# Patient Record
Sex: Male | Born: 1985 | Race: Black or African American | Hispanic: No | Marital: Married | Smoking: Never smoker
Health system: Southern US, Community
[De-identification: ages and names within clinical notes are randomized; demographics above are authoritative.]

## PROBLEM LIST (undated history)

## (undated) DIAGNOSIS — S83249A Other tear of medial meniscus, current injury, unspecified knee, initial encounter: Secondary | ICD-10-CM

## (undated) DIAGNOSIS — G4730 Sleep apnea, unspecified: Secondary | ICD-10-CM

---

## 2007-11-10 ENCOUNTER — Emergency Department (HOSPITAL_COMMUNITY): Admission: EM | Admit: 2007-11-10 | Discharge: 2007-11-11 | Payer: Self-pay | Admitting: Emergency Medicine

## 2008-12-10 ENCOUNTER — Emergency Department (HOSPITAL_COMMUNITY): Admission: EM | Admit: 2008-12-10 | Discharge: 2008-12-10 | Payer: Self-pay | Admitting: Emergency Medicine

## 2009-07-14 ENCOUNTER — Emergency Department (HOSPITAL_COMMUNITY): Admission: EM | Admit: 2009-07-14 | Discharge: 2009-07-14 | Payer: Self-pay | Admitting: Family Medicine

## 2010-05-07 ENCOUNTER — Emergency Department (HOSPITAL_COMMUNITY)
Admission: EM | Admit: 2010-05-07 | Discharge: 2010-05-07 | Disposition: A | Discharge status: Home / Self Care | Payer: BC Managed Care – PPO | Attending: Emergency Medicine | Admitting: Emergency Medicine

## 2010-05-07 DIAGNOSIS — M6740 Ganglion, unspecified site: Secondary | ICD-10-CM | POA: Insufficient documentation

## 2010-05-07 DIAGNOSIS — M25539 Pain in unspecified wrist: Secondary | ICD-10-CM | POA: Insufficient documentation

## 2010-11-04 ENCOUNTER — Inpatient Hospital Stay (INDEPENDENT_AMBULATORY_CARE_PROVIDER_SITE_OTHER)
Admission: RE | Admit: 2010-11-04 | Discharge: 2010-11-04 | Disposition: A | Discharge status: Home / Self Care | Payer: BC Managed Care – PPO | Source: Ambulatory Visit | Attending: Family Medicine | Admitting: Family Medicine

## 2010-11-04 DIAGNOSIS — R112 Nausea with vomiting, unspecified: Secondary | ICD-10-CM

## 2010-11-04 DIAGNOSIS — M549 Dorsalgia, unspecified: Secondary | ICD-10-CM

## 2010-11-25 LAB — WOUND CULTURE: Gram Stain: NONE SEEN

## 2012-09-02 ENCOUNTER — Ambulatory Visit (INDEPENDENT_AMBULATORY_CARE_PROVIDER_SITE_OTHER): Payer: BC Managed Care – PPO | Admitting: Physician Assistant

## 2012-09-02 VITALS — BP 118/68 | HR 62 | Temp 98.5°F | Resp 18 | Ht 70.5 in | Wt 183.0 lb

## 2012-09-02 DIAGNOSIS — B029 Zoster without complications: Secondary | ICD-10-CM

## 2012-09-02 MED ORDER — VALACYCLOVIR HCL 1 G PO TABS: 1000.0000 mg | ORAL_TABLET | Freq: Three times a day (TID) | ORAL | Status: DC

## 2012-09-02 NOTE — Progress Notes (Signed)
  Subjective:    Patient ID: Shawn [name], male    DOB: 1985-10-05, 27 y.o.   MRN: [id number]  HPI 27 year old male presents with 1 day history of rash on left side of his trunk.  States rash appeared yesterday but for the past 2 days before that he had intermittent stabbing pains in the same area.  Rash starts on his back and wraps around to the anterior portion of his trunk.  No fevers, chills, nausea, vomiting, or abdominal pain.  Did have chicken pox as a child.  No hx of similar rash or problems.  Patient is otherwise doing well with no other concerns today.     Review of Systems  Constitutional: Negative for fever and chills.  Gastrointestinal: Negative for nausea and vomiting.  Skin: Positive for rash.  Neurological: Negative for headaches.       Objective:   Physical Exam  Constitutional: He is oriented to person, place, and time. He appears well-developed and well-nourished.  HENT:  Head: Normocephalic and atraumatic.  Right Ear: External ear normal.  Left Ear: External ear normal.  Eyes: Conjunctivae are normal.  Neck: Normal range of motion.  Cardiovascular: Normal rate.   Pulmonary/Chest: Effort normal.  Neurological: He is alert and oriented to person, place, and time.  Skin:     Psychiatric: He has a normal mood and affect. His behavior is normal. Judgment and thought content normal.          Assessment & Plan:  Shingles  Start Valtrex 1000 mg tid x 7 days If pain does not improve in the next 3-4 days, let us know and can consider treating this Follow up if symptoms worsen or fail to improve.

## 2012-09-02 NOTE — Patient Instructions (Signed)
Shingles Shingles (herpes zoster) is an infection that is caused by the same virus that causes chickenpox (varicella). The infection causes a painful skin rash and fluid-filled blisters, which eventually break open, crust over, and heal. It may occur in any area of the body, but it usually affects only one side of the body or face. The pain of shingles usually lasts about 1 month. However, some people with shingles may develop long-term (chronic) pain in the affected area of the body. Shingles often occurs many years after the person had chickenpox. It is more common:  In people older than 50 years.  In people with weakened immune systems, such as those with HIV, AIDS, or cancer.  In people taking medicines that weaken the immune system, such as transplant medicines.  In people under great stress. CAUSES  Shingles is caused by the varicella zoster virus (VZV), which also causes chickenpox. After a person is infected with the virus, it can remain in the person's body for years in an inactive state (dormant). To cause shingles, the virus reactivates and breaks out as an infection in a nerve root. The virus can be spread from person to person (contagious) through contact with open blisters of the shingles rash. It will only spread to people who have not had chickenpox. When these people are exposed to the virus, they may develop chickenpox. They will not develop shingles. Once the blisters scab over, the person is no longer contagious and cannot spread the virus to others. SYMPTOMS  Shingles shows up in stages. The initial symptoms may be pain, itching, and tingling in an area of the skin. This pain is usually described as burning, stabbing, or throbbing.In a few days or Pedone, a painful red rash will appear in the area where the pain, itching, and tingling were felt. The rash is usually on one side of the body in a band or belt-like pattern. Then, the rash usually turns into fluid-filled blisters. They  will scab over and dry up in approximately 2 3 Nessel. Flu-like symptoms may also occur with the initial symptoms, the rash, or the blisters. These may include:  Fever.  Chills.  Headache.  Upset stomach. DIAGNOSIS  Your caregiver will perform a skin exam to diagnose shingles. Skin scrapings or fluid samples may also be taken from the blisters. This sample will be examined under a microscope or sent to a lab for further testing. TREATMENT  There is no specific cure for shingles. Your caregiver will likely prescribe medicines to help you manage the pain, recover faster, and avoid long-term problems. This may include antiviral drugs, anti-inflammatory drugs, and pain medicines. HOME CARE INSTRUCTIONS   Take a cool bath or apply cool compresses to the area of the rash or blisters as directed. This may help with the pain and itching.   Only take over-the-counter or prescription medicines as directed by your caregiver.   Rest as directed by your caregiver.  Keep your rash and blisters clean with mild soap and cool water or as directed by your caregiver.  Do not pick your blisters or scratch your rash. Apply an anti-itch cream or numbing creams to the affected area as directed by your caregiver.  Keep your shingles rash covered with a loose bandage (dressing).  Avoid skin contact with:  Babies.   Pregnant women.   Children with eczema.   Elderly people with transplants.   People with chronic illnesses, such as leukemia or AIDS.   Wear loose-fitting clothing to help ease   the pain of material rubbing against the rash.  Keep all follow-up appointments with your caregiver.If the area involved is on your face, you may receive a referral for follow-up to a specialist, such as an eye doctor (ophthalmologist) or an ear, nose, and throat (ENT) doctor. Keeping all follow-up appointments will help you avoid eye complications, chronic pain, or disability.  SEEK IMMEDIATE MEDICAL  CARE IF:   You have facial pain, pain around the eye area, or loss of feeling on one side of your face.  You have ear pain or ringing in your ear.  You have loss of taste.  Your pain is not relieved with prescribed medicines.   Your redness or swelling spreads.   You have more pain and swelling.  Your condition is worsening or has changed.   You have a feveror persistent symptoms for more than 2 3 days.  You have a fever and your symptoms suddenly get worse. MAKE SURE YOU:  Understand these instructions.  Will watch your condition.  Will get help right away if you are not doing well or get worse. Document Released: 02/10/2005 Document Revised: 11/05/2011 Document Reviewed: 09/25/2011 Exit[employer] Patient Information 2014 [employer].  

## 2015-09-06 ENCOUNTER — Encounter (HOSPITAL_COMMUNITY): Payer: Self-pay | Admitting: Emergency Medicine

## 2015-09-06 ENCOUNTER — Emergency Department (HOSPITAL_COMMUNITY)
Admission: EM | Admit: 2015-09-06 | Discharge: 2015-09-06 | Disposition: A | Discharge status: Home / Self Care | Payer: 59 | Attending: Emergency Medicine | Admitting: Emergency Medicine

## 2015-09-06 DIAGNOSIS — H9202 Otalgia, left ear: Secondary | ICD-10-CM | POA: Diagnosis present

## 2015-09-06 DIAGNOSIS — Z791 Long term (current) use of non-steroidal anti-inflammatories (NSAID): Secondary | ICD-10-CM | POA: Insufficient documentation

## 2015-09-06 DIAGNOSIS — H6122 Impacted cerumen, left ear: Secondary | ICD-10-CM | POA: Insufficient documentation

## 2015-09-06 MED ORDER — DOCUSATE SODIUM 50 MG/5ML PO LIQD
200.0000 mg | Freq: Once | ORAL | Status: AC
  Administered 2015-09-06: 200 mg via ORAL
  Filled 2015-09-06: qty 20

## 2015-09-06 MED ORDER — HYDROGEN PEROXIDE 3 % EX SOLN
CUTANEOUS | Status: AC
  Filled 2015-09-06: qty 473

## 2015-09-06 MED ORDER — HYDROGEN PEROXIDE 3 % EX SOLN
CUTANEOUS | Status: AC
  Administered 2015-09-06: 14:00:00
  Filled 2015-09-06: qty 473

## 2015-09-06 MED ORDER — CARBAMIDE PEROXIDE 6.5 % OT SOLN: 5.0000 [drp] | Freq: Two times a day (BID) | OTIC | Status: DC

## 2015-09-06 NOTE — Progress Notes (Signed)
[facility] ED CM noted pt with coverage but no pcp listed [facility] ED CM spoke with pt on how to obtain an in network pcp with insurance coverage via the customer service number or web site  Cm reviewed ED level of care for crisis/emergent services and community pcp level of care to manage continuous or chronic medical concerns.  The pt voiced understanding CM encouraged pt and discussed pt's responsibility to verify with pt's insurance carrier that any recommended medical provider offered by any emergency room or a hospital provider is within the carrier's network. The pt voiced understanding   

## 2015-09-06 NOTE — ED Notes (Signed)
Pt c/o L ear pain, denies n/v/d, denies fever

## 2015-09-06 NOTE — ED Provider Notes (Signed)
CSN: [id number]     Arrival date & time 09/06/15  0446 History   First MD Initiated Contact with Patient 09/06/15 0751     Chief Complaint  Patient presents with  . Otalgia    (Consider location/radiation/quality/duration/timing/severity/associated sxs/prior Treatment) Patient is a 30 y.o. male presenting with ear pain. The history is provided by the patient and medical records. No language interpreter was used.  Otalgia Associated symptoms: no abdominal pain, no congestion, no cough, no fever and no sore throat     Jp Gaspar is an otherwise healthy 30 y.o. male  who presents to the Emergency Department complaining of left ear pain. Patient states 1-2 [place] ago ear "felt clogged" - acute onset of throbbing pain began last night / early this morning. No recent swimming. No fever/chills, chest pain, sob, nasal congestion, sore throat. Ibuprofen taken prior to arrival with no relief. Has tried to clean out this ears with q-tips, but states he does not feel like he really got anything out. Hx of needing ears cleaned in the past per patient.    History reviewed. No pertinent past medical history. History reviewed. No pertinent past surgical history. Family History  Problem Relation Age of Onset  . Diabetes Mother    Social History  Substance Use Topics  . Smoking status: Never Smoker   . Smokeless tobacco: None  . Alcohol Use: Yes    Review of Systems  Constitutional: Negative for fever.  HENT: Positive for ear pain. Negative for congestion and sore throat.   Respiratory: Negative for cough and shortness of breath.   Cardiovascular: Negative for chest pain.  Gastrointestinal: Negative for abdominal pain.      Allergies  Review of patient's allergies indicates no known allergies.  Home Medications   Prior to Admission medications   Medication Sig Start Date End Date Taking? Authorizing Provider  ibuprofen (ADVIL,MOTRIN) 200 MG tablet Take 400 mg by mouth every 6 (six) hours  as needed for headache, mild pain or moderate pain.   Yes Historical Provider, MD  carbamide peroxide (DEBROX) 6.5 % otic solution Place 5 drops into the left ear 2 (two) times daily. 09/06/15   Chase Picket Larken Urias, PA-C  valACYclovir (VALTREX) 1000 MG tablet Take 1 tablet (1,000 mg total) by mouth 3 (three) times daily. Patient not taking: Reported on 09/06/2015 09/02/12   Heather M Marte, PA-C   BP 124/81 mmHg  Pulse 55  Temp(Src) 98.2 F (36.8 C) (Oral)  Resp 16  SpO2 100% Physical Exam  Constitutional: He is oriented to person, place, and time. He appears well-developed and well-nourished. No distress.  HENT:  Head: Normocephalic and atraumatic.  Mouth/Throat: Oropharynx is clear and moist.  Bilateral ears with cerumen impaction. No mastoid tenderness.   Neck: Normal range of motion. Neck supple.  No meningeal signs.   Cardiovascular: Normal rate, regular rhythm and normal heart sounds.   Pulmonary/Chest: Effort normal.  Lungs are clear to auscultation bilaterally - no w/r/r  Abdominal: Soft. He exhibits no distension. There is no tenderness.  Musculoskeletal: Normal range of motion.  Neurological: He is alert and oriented to person, place, and time.  Skin: Skin is warm and dry. He is not diaphoretic.  Nursing note and vitals reviewed.   ED Course  .Ear Cerumen Removal Date/Time: 09/06/2015 2:37 PM Performed by: Janyth Contes Authorized by: Janyth Contes Consent: Verbal consent obtained. Risks and benefits: risks, benefits and alternatives were discussed Patient understanding: patient states understanding of the procedure being performed  Patient identity confirmed: verbally with patient Ceruminolytics applied: Ceruminolytics applied prior to the procedure. Location details: left ear Procedure type: curette Patient tolerance: Patient tolerated the procedure well with no immediate complications Comments: Moderate amount of dark brown cerumen removed from ear,  partially visualization of TM which is not erythematous or bulging.   (including critical care time) Labs Review Labs Reviewed - No data to display  Imaging Review No results found. I have personally reviewed and evaluated these images and lab results as part of my medical decision-making.   EKG Interpretation None      MDM   Final diagnoses:  Cerumen impaction, left  Otalgia of left ear   Nimesh Carillo presents to ED for left ear pain. 1-2 [place] of ear feeling clogged, 1 day of throbbing pain. On exam, patient is afebrile and hemodynamically stable. Unable to visualize TM 2/2 cerumen impaction.   Cerumen removal performed by nursing staff after colace soak with moderate removal but TM still not visualized. Another soak then removal performed by myself with lighted curette. I was able to remove a moderate amount as well and visualize TM which was not erythematous or bulging. Doubt infection. I imagine pain and sensation of ear clogging is 2/2 large cerumen impaction. One more soak and removal by nursing staff. Per nurse, she was able to remove large amount of wax on 3rd attempt and patient had immediate relief of symptoms. He has a follow up appointment with ENT scheduled for 7/27 and I encouraged him to keep this scheduled appointment. Home care instructions and return precautions discussed. All questions answered.    [facility] HospitalJaime Pilcher Breya Cass, PA-C 09/06/15 1441  Derwood KaplanAnkit Nanavati, MD 09/06/15 1456

## 2015-09-06 NOTE — Discharge Instructions (Signed)
Use debrox ear drops as directed.  Return to ER or see urgent care/primary care in 3-4 days for recheck of ear if symptoms do not improve. Return to ER for new or worsening symptoms, any additional concerns.  Keep your scheduled appointment with the ear/nose/throat physician.

## 2015-09-06 NOTE — ED Notes (Signed)
Medium amount of brown cerum removed from left ear. Left tympanic membrane still not completely visible. PA made aware and to bedside.

## 2019-06-27 ENCOUNTER — Other Ambulatory Visit (HOSPITAL_BASED_OUTPATIENT_CLINIC_OR_DEPARTMENT_OTHER): Payer: Self-pay

## 2019-06-27 DIAGNOSIS — R0683 Snoring: Secondary | ICD-10-CM

## 2019-06-27 DIAGNOSIS — R0681 Apnea, not elsewhere classified: Secondary | ICD-10-CM

## 2019-09-09 ENCOUNTER — Ambulatory Visit (HOSPITAL_BASED_OUTPATIENT_CLINIC_OR_DEPARTMENT_OTHER): Payer: BC Managed Care – PPO | Admitting: Internal Medicine

## 2019-09-12 ENCOUNTER — Ambulatory Visit (HOSPITAL_BASED_OUTPATIENT_CLINIC_OR_DEPARTMENT_OTHER): Payer: BC Managed Care – PPO | Admitting: Internal Medicine

## 2019-10-17 ENCOUNTER — Encounter (HOSPITAL_BASED_OUTPATIENT_CLINIC_OR_DEPARTMENT_OTHER): Payer: BC Managed Care – PPO | Admitting: Internal Medicine

## 2019-10-26 ENCOUNTER — Ambulatory Visit (HOSPITAL_BASED_OUTPATIENT_CLINIC_OR_DEPARTMENT_OTHER): Payer: BC Managed Care – PPO | Attending: Otolaryngology | Admitting: Internal Medicine

## 2019-11-09 ENCOUNTER — Other Ambulatory Visit: Payer: Self-pay

## 2019-11-09 ENCOUNTER — Ambulatory Visit (HOSPITAL_BASED_OUTPATIENT_CLINIC_OR_DEPARTMENT_OTHER): Payer: BC Managed Care – PPO | Attending: Otolaryngology | Admitting: Internal Medicine

## 2019-11-09 DIAGNOSIS — R0681 Apnea, not elsewhere classified: Secondary | ICD-10-CM

## 2019-11-09 DIAGNOSIS — R0683 Snoring: Secondary | ICD-10-CM

## 2019-11-09 DIAGNOSIS — G4733 Obstructive sleep apnea (adult) (pediatric): Secondary | ICD-10-CM | POA: Insufficient documentation

## 2019-11-12 DIAGNOSIS — R0683 Snoring: Secondary | ICD-10-CM | POA: Diagnosis not present

## 2019-11-12 NOTE — Procedures (Signed)
    Patient Name: Shawn [name], Shawn [name] Date: 11/09/2019 Gender: Male D.O.B: Jul 05, 1985 Age (years): 33 Referring Provider: Christia Reading Height (inches): 71 Interpreting Physician: Jetty Duhamel MD, ABSM Weight (lbs): 190 RPSGT: [place] BMI: 26 MRN: [id number] Neck Size: 16.00  CLINICAL INFORMATION Sleep Study Type: HST Indication for sleep study: OSA Epworth Sleepiness Score: 10  SLEEP STUDY TECHNIQUE A multi-channel overnight portable sleep study was performed. The channels recorded were: nasal airflow, thoracic respiratory movement, and oxygen saturation with a pulse oximetry. Snoring was also monitored.  MEDICATIONS Patient self administered medications include: none reported.  SLEEP ARCHITECTURE Patient was studied for 260.4 minutes. The sleep efficiency was 100.0 % and the patient was supine for 0%. The arousal index was 0.0 per hour.  RESPIRATORY PARAMETERS The overall AHI was 27.2 per hour, with a central apnea index of 0.0 per hour. The oxygen nadir was 72% during sleep.  CARDIAC DATA Mean heart rate during sleep was 69.9 bpm.  IMPRESSIONS - Moderate obstructive sleep apnea occurred during this study (AHI = 27.2/h). - No significant central sleep apnea occurred during this study (CAI = 0.0/h). - Oxygen desaturation was noted during this study (Min O2 = 72%). Mean sat 92%. - Patient snored.  DIAGNOSIS - Obstructive Sleep Apnea (G47.33)  RECOMMENDATIONS - Suggest CPAP titration sleep study or autopap. Other options would be based on clinical judgment. - Be careful with alcohol, sedatives and other CNS depressants that may worsen sleep apnea and disrupt normal sleep architecture. - Sleep hygiene should be reviewed to assess factors that may improve sleep quality. - Weight management and regular exercise should be initiated or continued.  [Electronically signed] 11/12/2019 12:08 PM  Jetty Duhamel MD, ABSM Diplomate, American Board of Sleep  Medicine   NPI: [id number]                         Jetty Duhamel Diplomate, American Board of Sleep Medicine  ELECTRONICALLY SIGNED ON:  11/12/2019, 12:04 PM [facility] PH: [phone]   FX: [phone] ACCREDITED BY THE AMERICAN ACADEMY OF SLEEP MEDICINE

## 2020-05-16 ENCOUNTER — Emergency Department (HOSPITAL_COMMUNITY)
Admission: EM | Admit: 2020-05-16 | Discharge: 2020-05-16 | Disposition: A | Discharge status: Home / Self Care | Payer: BC Managed Care – PPO | Attending: Emergency Medicine | Admitting: Emergency Medicine

## 2020-05-16 ENCOUNTER — Emergency Department (HOSPITAL_COMMUNITY): Payer: BC Managed Care – PPO

## 2020-05-16 ENCOUNTER — Other Ambulatory Visit: Payer: Self-pay

## 2020-05-16 ENCOUNTER — Encounter (HOSPITAL_COMMUNITY): Payer: Self-pay | Admitting: Emergency Medicine

## 2020-05-16 DIAGNOSIS — X501XXA Overexertion from prolonged static or awkward postures, initial encounter: Secondary | ICD-10-CM | POA: Insufficient documentation

## 2020-05-16 DIAGNOSIS — R52 Pain, unspecified: Secondary | ICD-10-CM

## 2020-05-16 DIAGNOSIS — Y9367 Activity, basketball: Secondary | ICD-10-CM | POA: Diagnosis not present

## 2020-05-16 DIAGNOSIS — S8992XA Unspecified injury of left lower leg, initial encounter: Secondary | ICD-10-CM | POA: Diagnosis present

## 2020-05-16 DIAGNOSIS — S86812A Strain of other muscle(s) and tendon(s) at lower leg level, left leg, initial encounter: Secondary | ICD-10-CM | POA: Insufficient documentation

## 2020-05-16 NOTE — Discharge Instructions (Addendum)
As discussed, you have been diagnosed with a rupture of the patella tendon.  Please be sure to call our orthopedic colleagues tomorrow for appropriate ongoing outpatient management.  Tylenol, ice, ibuprofen will all be beneficial for pain control.  Return here for concerning changes in your condition.  Below is the MRI result from tonight:  IMPRESSION:  Complete tear of the patellar tendon from the inferior patellar pole  with approximately 7 mm of tendon retraction and resultant patellar  Alta. There is adjacent reactive marrow at the inferior patellar  pole.   Significant surrounding prepatellar and Hoffa's fat pad edema   Nondisplaced posterior medial meniscus tear   Intrasubstance sprain of the ACL with a probable tiny interstitial  tear

## 2020-05-16 NOTE — ED Triage Notes (Signed)
Patient presents with a possible dislocated left knee. Patient sustained this injury from falling while playing basketball. He noticed pain immediately when he fell on another players foot. EMS noted deformity to the left lower extremity.    EMS vitals: 132/92 BP 98 HR 97%

## 2020-05-16 NOTE — ED Provider Notes (Signed)
[facility]-EMERGENCY DEPT Provider Note   CSN: [id number] Arrival date & time: 05/16/20  1950     History Chief Complaint  Patient presents with  . Knee Pain    Shawn [name] is a 35 y.o. male.  HPI Patient presents with an inability to extend the left knee.  Patient has minimal pain in the area.  Onset of symptoms was just prior to ED arrival.  He was playing basketball, with no other individual with anything as he was landing following a jump. Patient landed awkwardly and since that time has been unable to extend the left leg secondary to issues with the left knee.  He did not fall entirely, has had no head pain, neck pain, chest pain, other complaints.  With no motion there are no complaints, but patient is unable to bear weight. He is generally well, denies medical problems, takes no medication regularly.    History reviewed. No pertinent past medical history.  There are no problems to display for this patient.   History reviewed. No pertinent surgical history.     Family History  Problem Relation Age of Onset  . Diabetes Mother     Social History   Tobacco Use  . Smoking status: Never Smoker  . Smokeless tobacco: Never Used  Substance Use Topics  . Alcohol use: Yes  . Drug use: No    Home Medications Prior to Admission medications   Medication Sig Start Date End Date Taking? Authorizing Provider  carbamide peroxide (DEBROX) 6.5 % otic solution Place 5 drops into the left ear 2 (two) times daily. 09/06/15   Ward, Chase Picket, PA-C  ibuprofen (ADVIL,MOTRIN) 200 MG tablet Take 400 mg by mouth every 6 (six) hours as needed for headache, mild pain or moderate pain.    [provider]  valACYclovir (VALTREX) 1000 MG tablet Take 1 tablet (1,000 mg total) by mouth 3 (three) times daily. Patient not taking: Reported on 09/06/2015 09/02/12   Nelva Nay, PA-C    Allergies    Patient has no known allergies.  Review of Systems    Review of Systems  Constitutional:       Per HPI, otherwise negative  HENT:       Per HPI, otherwise negative  Respiratory:       Per HPI, otherwise negative  Cardiovascular:       Per HPI, otherwise negative  Gastrointestinal: Negative for vomiting.  Endocrine:       Negative aside from HPI  Genitourinary:       Neg aside from HPI   Musculoskeletal:       Per HPI, otherwise negative  Skin: Negative.   Neurological: Negative for syncope.    Physical Exam Updated Vital Signs BP 140/80 (BP Location: Left Arm)   Pulse 80   Temp 99.9 F (37.7 C) (Oral)   Resp 17   Ht 5\' 11"  (1.803 m)   Wt 85.7 kg   SpO2 98%   BMI 26.36 kg/m   Physical Exam Vitals and nursing note reviewed.  Constitutional:      General: He is not in acute distress.    Appearance: He is well-developed.  HENT:     Head: Normocephalic and atraumatic.  Eyes:     Conjunctiva/sclera: Conjunctivae normal.  Cardiovascular:     Rate and Rhythm: Normal rate and regular rhythm.     Pulses: Normal pulses.  Pulmonary:     Effort: Pulmonary effort is normal. No respiratory distress.  Breath sounds: No stridor.  Abdominal:     General: There is no distension.  Musculoskeletal:     Comments: Patella alta, left side, with appreciable effusion inferior to the patella.  Patient can flex the left knee appropriately, but no capacity to extend the left knee.  He does flex and extend the hip spontaneously and to command, denies hip pain.  Ankle unremarkable as well.  Skin:    General: Skin is warm and dry.  Neurological:     Mental Status: He is alert and oriented to person, place, and time.      ED Results / Procedures / Treatments   Labs (all labs ordered are listed, but only abnormal results are displayed) Labs Reviewed - No data to display  EKG None  Radiology DG Knee 1-2 Views Left  Result Date: 05/16/2020 CLINICAL DATA:  35 year old male with trauma to the left knee. EXAM: LEFT KNEE - 1-2 VIEW  COMPARISON:  None. FINDINGS: There is elevation of the patella. Two small bone fragments in the anterior knee adjacent to the patellar tendon are suspicious for avulsion fracture. There is apparent 3 cm gap between the proximal patellar tendon and inferior patella suspicious for tendon rupture. No other acute fracture. The bones are well mineralized. No arthritic changes. No significant joint effusion. Mild soft tissues swelling of the anterior knee. IMPRESSION: Findings most suspicious for patellar tendon rupture and avulsion from the patellar apex. There is elevation of the patella. Further evaluation with MRI is recommended. Electronically Signed   By: Elgie Collard M.D.   On: 05/16/2020 21:06   MR KNEE LEFT WO CONTRAST  Result Date: 05/16/2020 CLINICAL DATA:  Basketball injury with knee pain EXAM: MRI OF THE LEFT KNEE WITHOUT CONTRAST TECHNIQUE: Multiplanar, multisequence MR imaging of the knee was performed. No intravenous contrast was administered. COMPARISON:  Radiograph same day FINDINGS: MENISCI Medial: There is a nondisplaced horizontal longitudinal tear seen of the posterior horn of the medial meniscus. Lateral: Intact. LIGAMENTS Cruciates: There is increased signal seen throughout the ACL with a probable partial interstitial tear seen at the posterior femoral insertion site. The PCL is intact. Collaterals: Mildly increased signal seen around the medial collateral ligament. The lateral collateral ligamentous complex is intact. CARTILAGE Patellofemoral: Normal. Medial compartment: Normal. Lateral compartment: Normal. BONES: Increased marrow signal seen at the inferior patellar pole. No displaced fractures are identified. JOINT: A small knee joint effusion is present. EXTENSOR MECHANISM: Complete disruption of the patellar tendon from the inferior patellar pole with approximately 7 mm of tendon retraction. Prepatellar edema with fluid and edema within the Hoffa's fat pad is seen. There is slight  patellar Oda Kilts present. The quadriceps tendon is intact. POPLITEAL FOSSA: No popliteal cyst. OTHER:  The visualized muscles are normal in appearance. IMPRESSION: Complete tear of the patellar tendon from the inferior patellar pole with approximately 7 mm of tendon retraction and resultant patellar Alta. There is adjacent reactive marrow at the inferior patellar pole. Significant surrounding prepatellar and Hoffa's fat pad edema Nondisplaced posterior medial meniscus tear Intrasubstance sprain of the ACL with a probable tiny interstitial tear Electronically Signed   By: Jonna Clark M.D.   On: 05/16/2020 22:33    Procedures Procedures   Medications Ordered in ED Medications - No data to display  ED Course  I have reviewed the triage vital signs and the nursing notes.  Pertinent labs & imaging results that were available during my care of the patient were reviewed by me and considered in  my medical decision making (see chart for details).   Repeat exam patient is in no distress.  I discussed x-ray abnormalities.   10:49 PM I discussed patient's case with her orthopedic colleague, Dr. Yevette Edwards. Repeat exam the patient is in similar condition.  No new complaints per He and I discussed x-ray results, MRI results. Both findings consistent with physical exam concerning for disruption of the patella tendon. Without other complaints, patient has had knee immobilization, crutches provided, will follow up tomorrow with our orthopedic colleagues.  Final Clinical Impression(s) / ED Diagnoses Final diagnoses:  Rupture of left patellar tendon, initial encounter    Rx / DC Orders ED Discharge Orders    None       Gerhard Munch, MD 05/16/20 2250

## 2020-05-17 ENCOUNTER — Encounter (HOSPITAL_BASED_OUTPATIENT_CLINIC_OR_DEPARTMENT_OTHER): Payer: Self-pay | Admitting: Orthopedic Surgery

## 2020-05-18 ENCOUNTER — Ambulatory Visit: Payer: Self-pay | Admitting: Physician Assistant

## 2020-05-18 NOTE — H&P (Signed)
Shawn [name] is an 35 y.o. male.   Chief Complaint: left knee pain and inability to straighten knee HPI: Patient playing basketball on 3/23 landed awkwardly following a jump and left knee gave out.  He had inability to straighten left knee following.  He was seen and evaluated in ER with xrays and MRI which confirmed medial meniscus tear and complete patellar tendon tear from inferior pole.  He was seen in our outpatient setting for treatment recommendations.  Past Medical History:  Diagnosis Date  . Medial meniscus tear y  . Sleep apnea     No past surgical history on file.  Family History  Problem Relation Age of Onset  . Diabetes Mother    Social History:  reports that he has never smoked. He has never used smokeless tobacco. He reports current alcohol use. He reports that he does not use drugs.  Allergies: No Known Allergies  (Not in a hospital admission)   No results found for this or any previous visit (from the past 48 hour(s)). DG Knee 1-2 Views Left  Result Date: 05/16/2020 CLINICAL DATA:  35 year old male with trauma to the left knee. EXAM: LEFT KNEE - 1-2 VIEW COMPARISON:  None. FINDINGS: There is elevation of the patella. Two small bone fragments in the anterior knee adjacent to the patellar tendon are suspicious for avulsion fracture. There is apparent 3 cm gap between the proximal patellar tendon and inferior patella suspicious for tendon rupture. No other acute fracture. The bones are well mineralized. No arthritic changes. No significant joint effusion. Mild soft tissues swelling of the anterior knee. IMPRESSION: Findings most suspicious for patellar tendon rupture and avulsion from the patellar apex. There is elevation of the patella. Further evaluation with MRI is recommended. Electronically Signed   By: Elgie Collard M.D.   On: 05/16/2020 21:06   MR KNEE LEFT WO CONTRAST  Result Date: 05/16/2020 CLINICAL DATA:  Basketball injury with knee pain EXAM: MRI OF THE LEFT  KNEE WITHOUT CONTRAST TECHNIQUE: Multiplanar, multisequence MR imaging of the knee was performed. No intravenous contrast was administered. COMPARISON:  Radiograph same day FINDINGS: MENISCI Medial: There is a nondisplaced horizontal longitudinal tear seen of the posterior horn of the medial meniscus. Lateral: Intact. LIGAMENTS Cruciates: There is increased signal seen throughout the ACL with a probable partial interstitial tear seen at the posterior femoral insertion site. The PCL is intact. Collaterals: Mildly increased signal seen around the medial collateral ligament. The lateral collateral ligamentous complex is intact. CARTILAGE Patellofemoral: Normal. Medial compartment: Normal. Lateral compartment: Normal. BONES: Increased marrow signal seen at the inferior patellar pole. No displaced fractures are identified. JOINT: A small knee joint effusion is present. EXTENSOR MECHANISM: Complete disruption of the patellar tendon from the inferior patellar pole with approximately 7 mm of tendon retraction. Prepatellar edema with fluid and edema within the Hoffa's fat pad is seen. There is slight patellar Oda Kilts present. The quadriceps tendon is intact. POPLITEAL FOSSA: No popliteal cyst. OTHER:  The visualized muscles are normal in appearance. IMPRESSION: Complete tear of the patellar tendon from the inferior patellar pole with approximately 7 mm of tendon retraction and resultant patellar Alta. There is adjacent reactive marrow at the inferior patellar pole. Significant surrounding prepatellar and Hoffa's fat pad edema Nondisplaced posterior medial meniscus tear Intrasubstance sprain of the ACL with a probable tiny interstitial tear Electronically Signed   By: Jonna Clark M.D.   On: 05/16/2020 22:33    Review of Systems  Musculoskeletal: Positive for gait problem  and joint swelling.  All other systems reviewed and are negative.   There were no vitals taken for this visit. Physical Exam Constitutional:       Appearance: Normal appearance.  HENT:     Head: Normocephalic and atraumatic.  Eyes:     Extraocular Movements: Extraocular movements intact.     Pupils: Pupils are equal, round, and reactive to light.  Cardiovascular:     Rate and Rhythm: Normal rate.     Pulses: Normal pulses.  Pulmonary:     Effort: Pulmonary effort is normal. No respiratory distress.  Abdominal:     General: Abdomen is flat. There is no distension.     Tenderness: There is no abdominal tenderness.  Musculoskeletal:     Cervical back: Normal range of motion and neck supple.     Left knee: Swelling, deformity, effusion and bony tenderness present. No erythema or lacerations. Decreased range of motion. Tenderness present over the medial joint line. Abnormal patellar mobility.  Skin:    General: Skin is warm and dry.     Findings: No erythema or rash.  Neurological:     General: No focal deficit present.     Mental Status: He is alert and oriented to person, place, and time.  Psychiatric:        Mood and Affect: Mood normal.        Behavior: Behavior normal.      Assessment/Plan Left knee patellar tendon tear and medial meniscus tear  Discussed risks and benefits of left knee arthroscopy medial menisectomy and patellar tendon repair he wishes to proceed.  We will get this scheduled in an outpatient setting as soon as possible.  He is in a knee immobilizer and may WBAT.    Margart Sickles, PA-C 05/18/2020, 8:02 AM

## 2020-05-18 NOTE — H&P (View-Only) (Signed)
Shawn [name] is an 35 y.o. male.   Chief Complaint: left knee pain and inability to straighten knee HPI: Patient playing basketball on 3/23 landed awkwardly following a jump and left knee gave out.  He had inability to straighten left knee following.  He was seen and evaluated in ER with xrays and MRI which confirmed medial meniscus tear and complete patellar tendon tear from inferior pole.  He was seen in our outpatient setting for treatment recommendations.  Past Medical History:  Diagnosis Date  . Medial meniscus tear y  . Sleep apnea     No past surgical history on file.  Family History  Problem Relation Age of Onset  . Diabetes Mother    Social History:  reports that he has never smoked. He has never used smokeless tobacco. He reports current alcohol use. He reports that he does not use drugs.  Allergies: No Known Allergies  (Not in a hospital admission)   No results found for this or any previous visit (from the past 48 hour(s)). DG Knee 1-2 Views Left  Result Date: 05/16/2020 CLINICAL DATA:  35 year old male with trauma to the left knee. EXAM: LEFT KNEE - 1-2 VIEW COMPARISON:  None. FINDINGS: There is elevation of the patella. Two small bone fragments in the anterior knee adjacent to the patellar tendon are suspicious for avulsion fracture. There is apparent 3 cm gap between the proximal patellar tendon and inferior patella suspicious for tendon rupture. No other acute fracture. The bones are well mineralized. No arthritic changes. No significant joint effusion. Mild soft tissues swelling of the anterior knee. IMPRESSION: Findings most suspicious for patellar tendon rupture and avulsion from the patellar apex. There is elevation of the patella. Further evaluation with MRI is recommended. Electronically Signed   By: Elgie Collard M.D.   On: 05/16/2020 21:06   MR KNEE LEFT WO CONTRAST  Result Date: 05/16/2020 CLINICAL DATA:  Basketball injury with knee pain EXAM: MRI OF THE LEFT  KNEE WITHOUT CONTRAST TECHNIQUE: Multiplanar, multisequence MR imaging of the knee was performed. No intravenous contrast was administered. COMPARISON:  Radiograph same day FINDINGS: MENISCI Medial: There is a nondisplaced horizontal longitudinal tear seen of the posterior horn of the medial meniscus. Lateral: Intact. LIGAMENTS Cruciates: There is increased signal seen throughout the ACL with a probable partial interstitial tear seen at the posterior femoral insertion site. The PCL is intact. Collaterals: Mildly increased signal seen around the medial collateral ligament. The lateral collateral ligamentous complex is intact. CARTILAGE Patellofemoral: Normal. Medial compartment: Normal. Lateral compartment: Normal. BONES: Increased marrow signal seen at the inferior patellar pole. No displaced fractures are identified. JOINT: A small knee joint effusion is present. EXTENSOR MECHANISM: Complete disruption of the patellar tendon from the inferior patellar pole with approximately 7 mm of tendon retraction. Prepatellar edema with fluid and edema within the Hoffa's fat pad is seen. There is slight patellar Oda Kilts present. The quadriceps tendon is intact. POPLITEAL FOSSA: No popliteal cyst. OTHER:  The visualized muscles are normal in appearance. IMPRESSION: Complete tear of the patellar tendon from the inferior patellar pole with approximately 7 mm of tendon retraction and resultant patellar Alta. There is adjacent reactive marrow at the inferior patellar pole. Significant surrounding prepatellar and Hoffa's fat pad edema Nondisplaced posterior medial meniscus tear Intrasubstance sprain of the ACL with a probable tiny interstitial tear Electronically Signed   By: Jonna Clark M.D.   On: 05/16/2020 22:33    Review of Systems  Musculoskeletal: Positive for gait problem  and joint swelling.  All other systems reviewed and are negative.   There were no vitals taken for this visit. Physical Exam Constitutional:       Appearance: Normal appearance.  HENT:     Head: Normocephalic and atraumatic.  Eyes:     Extraocular Movements: Extraocular movements intact.     Pupils: Pupils are equal, round, and reactive to light.  Cardiovascular:     Rate and Rhythm: Normal rate.     Pulses: Normal pulses.  Pulmonary:     Effort: Pulmonary effort is normal. No respiratory distress.  Abdominal:     General: Abdomen is flat. There is no distension.     Tenderness: There is no abdominal tenderness.  Musculoskeletal:     Cervical back: Normal range of motion and neck supple.     Left knee: Swelling, deformity, effusion and bony tenderness present. No erythema or lacerations. Decreased range of motion. Tenderness present over the medial joint line. Abnormal patellar mobility.  Skin:    General: Skin is warm and dry.     Findings: No erythema or rash.  Neurological:     General: No focal deficit present.     Mental Status: He is alert and oriented to person, place, and time.  Psychiatric:        Mood and Affect: Mood normal.        Behavior: Behavior normal.      Assessment/Plan Left knee patellar tendon tear and medial meniscus tear  Discussed risks and benefits of left knee arthroscopy medial menisectomy and patellar tendon repair he wishes to proceed.  We will get this scheduled in an outpatient setting as soon as possible.  He is in a knee immobilizer and may WBAT.    Asley Baskerville, PA-C 05/18/2020, 8:02 AM   

## 2020-05-21 ENCOUNTER — Other Ambulatory Visit (HOSPITAL_COMMUNITY)
Admission: RE | Admit: 2020-05-21 | Discharge: 2020-05-21 | Disposition: A | Discharge status: Home / Self Care | Payer: BC Managed Care – PPO | Source: Ambulatory Visit | Attending: Orthopedic Surgery | Admitting: Orthopedic Surgery

## 2020-05-21 DIAGNOSIS — Z01812 Encounter for preprocedural laboratory examination: Secondary | ICD-10-CM | POA: Insufficient documentation

## 2020-05-21 DIAGNOSIS — Y9367 Activity, basketball: Secondary | ICD-10-CM | POA: Diagnosis not present

## 2020-05-21 DIAGNOSIS — Z20822 Contact with and (suspected) exposure to covid-19: Secondary | ICD-10-CM | POA: Insufficient documentation

## 2020-05-21 DIAGNOSIS — G4730 Sleep apnea, unspecified: Secondary | ICD-10-CM | POA: Diagnosis not present

## 2020-05-21 DIAGNOSIS — S76112A Strain of left quadriceps muscle, fascia and tendon, initial encounter: Secondary | ICD-10-CM | POA: Diagnosis not present

## 2020-05-21 DIAGNOSIS — X58XXXA Exposure to other specified factors, initial encounter: Secondary | ICD-10-CM | POA: Diagnosis not present

## 2020-05-22 ENCOUNTER — Other Ambulatory Visit (HOSPITAL_COMMUNITY): Payer: BC Managed Care – PPO

## 2020-05-22 LAB — SARS CORONAVIRUS 2 (TAT 6-24 HRS): SARS Coronavirus 2: NEGATIVE

## 2020-05-22 NOTE — Progress Notes (Signed)
Reminded pt to come in for lab work. Pt verbalized understanding.

## 2020-05-23 ENCOUNTER — Encounter (HOSPITAL_BASED_OUTPATIENT_CLINIC_OR_DEPARTMENT_OTHER)
Admission: RE | Admit: 2020-05-23 | Discharge: 2020-05-23 | Disposition: A | Discharge status: Home / Self Care | Payer: BC Managed Care – PPO | Source: Ambulatory Visit | Attending: Orthopedic Surgery | Admitting: Orthopedic Surgery

## 2020-05-23 DIAGNOSIS — Z01818 Encounter for other preprocedural examination: Secondary | ICD-10-CM | POA: Insufficient documentation

## 2020-05-23 DIAGNOSIS — S76112A Strain of left quadriceps muscle, fascia and tendon, initial encounter: Secondary | ICD-10-CM | POA: Diagnosis not present

## 2020-05-23 LAB — APTT: aPTT: 28 seconds (ref 24–36)

## 2020-05-23 LAB — COMPREHENSIVE METABOLIC PANEL
ALT: 31 U/L (ref 0–44)
AST: 26 U/L (ref 15–41)
Albumin: 3.5 g/dL (ref 3.5–5.0)
Alkaline Phosphatase: 75 U/L (ref 38–126)
Anion gap: 6 (ref 5–15)
BUN: 13 mg/dL (ref 6–20)
CO2: 29 mmol/L (ref 22–32)
Calcium: 9.4 mg/dL (ref 8.9–10.3)
Chloride: 104 mmol/L (ref 98–111)
Creatinine, Ser: 1.04 mg/dL (ref 0.61–1.24)
GFR, Estimated: >60 mL/min (ref >60)
Glucose, Bld: 103 mg/dL — ABNORMAL HIGH (ref 70–99)
Potassium: 4.7 mmol/L (ref 3.5–5.1)
Sodium: 139 mmol/L (ref 135–145)
Total Bilirubin: 0.7 mg/dL (ref 0.3–1.2)
Total Protein: 7.2 g/dL (ref 6.5–8.1)

## 2020-05-23 LAB — CBC WITH DIFFERENTIAL/PLATELET
Abs Immature Granulocytes: 0.02 10*3/uL (ref 0.00–0.07)
Basophils Absolute: 0 10*3/uL (ref 0.0–0.1)
Basophils Relative: 1 %
Eosinophils Absolute: 0.4 10*3/uL (ref 0.0–0.5)
Eosinophils Relative: 4 %
HCT: 47 % (ref 39.0–52.0)
Hemoglobin: 15 g/dL (ref 13.0–17.0)
Immature Granulocytes: 0 %
Lymphocytes Relative: 29 %
Lymphs Abs: 2.6 10*3/uL (ref 0.7–4.0)
MCH: 26.4 pg (ref 26.0–34.0)
MCHC: 31.9 g/dL (ref 30.0–36.0)
MCV: 82.6 fL (ref 80.0–100.0)
Monocytes Absolute: 0.7 10*3/uL (ref 0.1–1.0)
Monocytes Relative: 8 %
Neutro Abs: 5.1 10*3/uL (ref 1.7–7.7)
Neutrophils Relative %: 58 %
Platelets: 289 10*3/uL (ref 150–400)
RBC: 5.69 MIL/uL (ref 4.22–5.81)
RDW: 12.9 % (ref 11.5–15.5)
WBC: 8.9 10*3/uL (ref 4.0–10.5)
nRBC: 0 % (ref 0.0–0.2)

## 2020-05-23 LAB — TYPE AND SCREEN
ABO/RH(D): B POS
Antibody Screen: NEGATIVE

## 2020-05-23 LAB — PROTIME-INR
INR: 1 (ref 0.8–1.2)
Prothrombin Time: 12.7 seconds (ref 11.4–15.2)

## 2020-05-23 NOTE — Progress Notes (Signed)

## 2020-05-24 ENCOUNTER — Encounter (HOSPITAL_BASED_OUTPATIENT_CLINIC_OR_DEPARTMENT_OTHER): Payer: Self-pay | Admitting: Orthopedic Surgery

## 2020-05-24 ENCOUNTER — Encounter (HOSPITAL_BASED_OUTPATIENT_CLINIC_OR_DEPARTMENT_OTHER)
Admission: RE | Disposition: A | Discharge status: Home / Self Care | Payer: Self-pay | Source: Home / Self Care | Attending: Orthopedic Surgery

## 2020-05-24 ENCOUNTER — Other Ambulatory Visit: Payer: Self-pay

## 2020-05-24 ENCOUNTER — Ambulatory Visit (HOSPITAL_BASED_OUTPATIENT_CLINIC_OR_DEPARTMENT_OTHER): Payer: BC Managed Care – PPO | Admitting: Certified Registered"

## 2020-05-24 ENCOUNTER — Ambulatory Visit (HOSPITAL_BASED_OUTPATIENT_CLINIC_OR_DEPARTMENT_OTHER)
Admission: RE | Admit: 2020-05-24 | Discharge: 2020-05-24 | Disposition: A | Discharge status: Home / Self Care | Payer: BC Managed Care – PPO | Attending: Orthopedic Surgery | Admitting: Orthopedic Surgery

## 2020-05-24 DIAGNOSIS — Z20822 Contact with and (suspected) exposure to covid-19: Secondary | ICD-10-CM | POA: Insufficient documentation

## 2020-05-24 DIAGNOSIS — S76112A Strain of left quadriceps muscle, fascia and tendon, initial encounter: Secondary | ICD-10-CM | POA: Diagnosis not present

## 2020-05-24 DIAGNOSIS — Y9367 Activity, basketball: Secondary | ICD-10-CM | POA: Insufficient documentation

## 2020-05-24 DIAGNOSIS — G4730 Sleep apnea, unspecified: Secondary | ICD-10-CM | POA: Insufficient documentation

## 2020-05-24 DIAGNOSIS — X58XXXA Exposure to other specified factors, initial encounter: Secondary | ICD-10-CM | POA: Insufficient documentation

## 2020-05-24 HISTORY — DX: Sleep apnea, unspecified: G47.30

## 2020-05-24 HISTORY — DX: Other tear of medial meniscus, current injury, unspecified knee, initial encounter: S83.249A

## 2020-05-24 HISTORY — PX: PATELLAR TENDON REPAIR: SHX737

## 2020-05-24 HISTORY — PX: KNEE ARTHROSCOPY: SHX127

## 2020-05-24 LAB — ABO/RH: ABO/RH(D): B POS

## 2020-05-24 SURGERY — ARTHROSCOPY, KNEE
Anesthesia: General | Site: Knee | Laterality: Left

## 2020-05-24 MED ORDER — CEFAZOLIN SODIUM-DEXTROSE 2-3 GM-%(50ML) IV SOLR
INTRAVENOUS | Status: DC | PRN
  Administered 2020-05-24: 2 g via INTRAVENOUS

## 2020-05-24 MED ORDER — BUPIVACAINE-EPINEPHRINE (PF) 0.5% -1:200000 IJ SOLN
INTRAMUSCULAR | Status: DC | PRN
  Administered 2020-05-24: 30 mL via PERINEURAL

## 2020-05-24 MED ORDER — SODIUM CHLORIDE 0.9 % IV SOLN: INTRAVENOUS | Status: DC

## 2020-05-24 MED ORDER — CEFAZOLIN SODIUM-DEXTROSE 2-4 GM/100ML-% IV SOLN
INTRAVENOUS | Status: AC
  Filled 2020-05-24: qty 100

## 2020-05-24 MED ORDER — PROPOFOL 10 MG/ML IV BOLUS
INTRAVENOUS | Status: AC
  Filled 2020-05-24: qty 20

## 2020-05-24 MED ORDER — LACTATED RINGERS IV SOLN: INTRAVENOUS | Status: DC

## 2020-05-24 MED ORDER — EPINEPHRINE PF 1 MG/ML IJ SOLN
INTRAMUSCULAR | Status: AC
  Filled 2020-05-24: qty 2

## 2020-05-24 MED ORDER — FENTANYL CITRATE (PF) 100 MCG/2ML IJ SOLN
25.0000 ug | INTRAMUSCULAR | Status: DC | PRN
  Administered 2020-05-24 (×2): 50 ug via INTRAVENOUS

## 2020-05-24 MED ORDER — FENTANYL CITRATE (PF) 100 MCG/2ML IJ SOLN
100.0000 ug | Freq: Once | INTRAMUSCULAR | Status: AC
  Administered 2020-05-24: 50 ug via INTRAVENOUS

## 2020-05-24 MED ORDER — ONDANSETRON HCL 4 MG/2ML IJ SOLN
INTRAMUSCULAR | Status: AC
  Filled 2020-05-24: qty 2

## 2020-05-24 MED ORDER — LACTATED RINGERS IV SOLN: INTRAVENOUS | Status: DC | PRN

## 2020-05-24 MED ORDER — MIDAZOLAM HCL 2 MG/2ML IJ SOLN
2.0000 mg | Freq: Once | INTRAMUSCULAR | Status: AC
  Administered 2020-05-24: 2 mg via INTRAVENOUS

## 2020-05-24 MED ORDER — BUPIVACAINE HCL (PF) 0.25 % IJ SOLN
INTRAMUSCULAR | Status: AC
  Filled 2020-05-24: qty 30

## 2020-05-24 MED ORDER — PROMETHAZINE HCL 25 MG/ML IJ SOLN: 6.2500 mg | INTRAMUSCULAR | Status: DC | PRN

## 2020-05-24 MED ORDER — BUPIVACAINE HCL (PF) 0.5 % IJ SOLN
INTRAMUSCULAR | Status: AC
  Filled 2020-05-24: qty 30

## 2020-05-24 MED ORDER — MIDAZOLAM HCL 2 MG/2ML IJ SOLN
INTRAMUSCULAR | Status: AC
  Filled 2020-05-24: qty 2

## 2020-05-24 MED ORDER — HYDROMORPHONE HCL 1 MG/ML IJ SOLN
INTRAMUSCULAR | Status: AC
  Filled 2020-05-24: qty 0.5

## 2020-05-24 MED ORDER — OXYCODONE HCL 5 MG PO TABS: ORAL_TABLET | ORAL | 0 refills | Status: AC

## 2020-05-24 MED ORDER — LIDOCAINE 2% (20 MG/ML) 5 ML SYRINGE
INTRAMUSCULAR | Status: AC
  Filled 2020-05-24: qty 5

## 2020-05-24 MED ORDER — CELECOXIB 200 MG PO CAPS: 200.0000 mg | ORAL_CAPSULE | Freq: Two times a day (BID) | ORAL | 2 refills | Status: AC

## 2020-05-24 MED ORDER — FENTANYL CITRATE (PF) 100 MCG/2ML IJ SOLN
INTRAMUSCULAR | Status: AC
  Filled 2020-05-24: qty 2

## 2020-05-24 MED ORDER — ONDANSETRON HCL 4 MG/2ML IJ SOLN
INTRAMUSCULAR | Status: DC | PRN
  Administered 2020-05-24: 4 mg via INTRAVENOUS

## 2020-05-24 MED ORDER — FENTANYL CITRATE (PF) 100 MCG/2ML IJ SOLN
INTRAMUSCULAR | Status: DC | PRN
  Administered 2020-05-24 (×2): 50 ug via INTRAVENOUS

## 2020-05-24 MED ORDER — ACETAMINOPHEN 500 MG PO TABS
1000.0000 mg | ORAL_TABLET | Freq: Once | ORAL | Status: AC
  Administered 2020-05-24: 1000 mg via ORAL

## 2020-05-24 MED ORDER — HYDROMORPHONE HCL 1 MG/ML IJ SOLN
0.5000 mg | Freq: Once | INTRAMUSCULAR | Status: AC
Start: 2020-05-24 — End: 2020-05-24
  Administered 2020-05-24: 0.5 mg via INTRAVENOUS

## 2020-05-24 MED ORDER — LIDOCAINE HCL (CARDIAC) PF 100 MG/5ML IV SOSY: PREFILLED_SYRINGE | INTRAVENOUS | Status: DC | PRN

## 2020-05-24 MED ORDER — CLONIDINE HCL (ANALGESIA) 100 MCG/ML EP SOLN
EPIDURAL | Status: DC | PRN
  Administered 2020-05-24: 100 ug

## 2020-05-24 MED ORDER — CEFAZOLIN SODIUM-DEXTROSE 2-4 GM/100ML-% IV SOLN: 2.0000 g | INTRAVENOUS | Status: DC

## 2020-05-24 MED ORDER — METHYLPREDNISOLONE ACETATE 40 MG/ML IJ SUSP
INTRAMUSCULAR | Status: AC
  Filled 2020-05-24: qty 1

## 2020-05-24 MED ORDER — ACETAMINOPHEN 500 MG PO TABS
ORAL_TABLET | ORAL | Status: AC
  Filled 2020-05-24: qty 2

## 2020-05-24 MED ORDER — METHYLPREDNISOLONE ACETATE 80 MG/ML IJ SUSP
INTRAMUSCULAR | Status: AC
  Filled 2020-05-24: qty 1

## 2020-05-24 MED ORDER — DEXAMETHASONE SODIUM PHOSPHATE 10 MG/ML IJ SOLN
INTRAMUSCULAR | Status: AC
  Filled 2020-05-24: qty 1

## 2020-05-24 MED ORDER — DEXAMETHASONE SODIUM PHOSPHATE 10 MG/ML IJ SOLN
INTRAMUSCULAR | Status: DC | PRN
  Administered 2020-05-24: 10 mg via INTRAVENOUS

## 2020-05-24 MED ORDER — PROPOFOL 10 MG/ML IV BOLUS
INTRAVENOUS | Status: DC | PRN
  Administered 2020-05-24: 250 mg via INTRAVENOUS

## 2020-05-24 MED ORDER — ASPIRIN EC 81 MG PO TBEC: 81.0000 mg | DELAYED_RELEASE_TABLET | Freq: Two times a day (BID) | ORAL | 0 refills | Status: AC

## 2020-05-24 MED ORDER — SODIUM CHLORIDE 0.9 % IR SOLN
Status: DC | PRN
  Administered 2020-05-24: 2500 mL

## 2020-05-24 SURGICAL SUPPLY — 88 items
ANCHOR SUT BIOCOMP CORKSREW (Anchor) ×6 IMPLANT
BANDAGE ESMARK 6X9 LF (GAUZE/BANDAGES/DRESSINGS) ×1 IMPLANT
BENZOIN TINCTURE PRP APPL 2/3 (GAUZE/BANDAGES/DRESSINGS) ×3 IMPLANT
BLADE EXCALIBUR 4.0MM X 13CM (MISCELLANEOUS) ×1
BLADE EXCALIBUR 4.0X13 (MISCELLANEOUS) ×2 IMPLANT
BLADE SURG 15 STRL LF DISP TIS (BLADE) ×2 IMPLANT
BLADE SURG 15 STRL SS (BLADE) ×4
BNDG ELASTIC 4X5.8 VLCR STR LF (GAUZE/BANDAGES/DRESSINGS) IMPLANT
BNDG ELASTIC 6X5.8 VLCR STR LF (GAUZE/BANDAGES/DRESSINGS) ×3 IMPLANT
BNDG ESMARK 6X9 LF (GAUZE/BANDAGES/DRESSINGS) ×3
BNDG GAUZE ELAST 4 BULKY (GAUZE/BANDAGES/DRESSINGS) ×3 IMPLANT
CANISTER SUCT 1200ML W/VALVE (MISCELLANEOUS) IMPLANT
CLOSURE WOUND 1/2 X4 (GAUZE/BANDAGES/DRESSINGS) ×1
COVER WAND RF STERILE (DRAPES) IMPLANT
CUFF TOURN SGL QUICK 34 (TOURNIQUET CUFF) ×2
CUFF TRNQT CYL 34X4.125X (TOURNIQUET CUFF) ×1 IMPLANT
DECANTER SPIKE VIAL GLASS SM (MISCELLANEOUS) IMPLANT
DISSECTOR 3.5MM X 13CM CVD (MISCELLANEOUS) IMPLANT
DISSECTOR 4.0MM X 13CM (MISCELLANEOUS) IMPLANT
DRAPE ARTHROSCOPY W/POUCH 90 (DRAPES) ×3 IMPLANT
DRAPE EXTREMITY T 121X128X90 (DISPOSABLE) ×3 IMPLANT
DRAPE IMP U-DRAPE 54X76 (DRAPES) ×3 IMPLANT
DRSG EMULSION OIL 3X3 NADH (GAUZE/BANDAGES/DRESSINGS) IMPLANT
DURAPREP 26ML APPLICATOR (WOUND CARE) ×3 IMPLANT
ELECT REM PT RETURN 9FT ADLT (ELECTROSURGICAL) ×3
ELECTRODE REM PT RTRN 9FT ADLT (ELECTROSURGICAL) ×1 IMPLANT
EXCALIBUR 3.8MM X 13CM (MISCELLANEOUS) IMPLANT
GAUZE SPONGE 4X4 12PLY STRL (GAUZE/BANDAGES/DRESSINGS) ×3 IMPLANT
GLOVE SRG 8 PF TXTR STRL LF DI (GLOVE) ×2 IMPLANT
GLOVE SURG ENC MOIS LTX SZ6.5 (GLOVE) ×3 IMPLANT
GLOVE SURG ENC MOIS LTX SZ7.5 (GLOVE) ×3 IMPLANT
GLOVE SURG ORTHO LTX SZ8 (GLOVE) ×3 IMPLANT
GLOVE SURG UNDER POLY LF SZ7 (GLOVE) ×6 IMPLANT
GLOVE SURG UNDER POLY LF SZ8 (GLOVE) ×4
GLOVE SURG UNDER POLY LF SZ8.5 (GLOVE) IMPLANT
GOWN STRL REUS W/ TWL LRG LVL3 (GOWN DISPOSABLE) ×1 IMPLANT
GOWN STRL REUS W/ TWL XL LVL3 (GOWN DISPOSABLE) ×1 IMPLANT
GOWN STRL REUS W/TWL LRG LVL3 (GOWN DISPOSABLE) ×2
GOWN STRL REUS W/TWL XL LVL3 (GOWN DISPOSABLE) ×5 IMPLANT
HOLDER KNEE FOAM BLUE (MISCELLANEOUS) ×3 IMPLANT
IMMOBILIZER KNEE 22 UNIV (SOFTGOODS) ×3 IMPLANT
IMMOBILIZER KNEE 24 THIGH 36 (MISCELLANEOUS) IMPLANT
IMMOBILIZER KNEE 24 UNIV (MISCELLANEOUS)
KIT TRANSTIBIAL (DISPOSABLE) ×3 IMPLANT
MANIFOLD NEPTUNE II (INSTRUMENTS) ×3 IMPLANT
NDL SAFETY ECLIPSE 18X1.5 (NEEDLE) ×1 IMPLANT
NDL SUT 6 .5 CRC .975X.05 MAYO (NEEDLE) IMPLANT
NEEDLE 1/2 CIR MAYO (NEEDLE) ×3 IMPLANT
NEEDLE HYPO 18GX1.5 SHARP (NEEDLE) ×2
NEEDLE MAYO TAPER (NEEDLE)
NS IRRIG 1000ML POUR BTL (IV SOLUTION) ×3 IMPLANT
PACK ARTHROSCOPY DSU (CUSTOM PROCEDURE TRAY) ×3 IMPLANT
PACK BASIN DAY SURGERY FS (CUSTOM PROCEDURE TRAY) ×3 IMPLANT
PACK ICE MAXI GEL EZY WRAP (MISCELLANEOUS) ×3 IMPLANT
PADDING CAST ABS 3INX4YD NS (CAST SUPPLIES)
PADDING CAST ABS 4INX4YD NS (CAST SUPPLIES)
PADDING CAST ABS COTTON 3X4 (CAST SUPPLIES) IMPLANT
PADDING CAST ABS COTTON 4X4 ST (CAST SUPPLIES) IMPLANT
PASSER SUT SWANSON 36MM LOOP (INSTRUMENTS) IMPLANT
PENCIL SMOKE EVACUATOR (MISCELLANEOUS) ×3 IMPLANT
PORT APPOLLO RF 90DEGREE MULTI (SURGICAL WAND) IMPLANT
STRIP CLOSURE SKIN 1/2X4 (GAUZE/BANDAGES/DRESSINGS) ×2 IMPLANT
SUCTION FRAZIER HANDLE 10FR (MISCELLANEOUS)
SUCTION TUBE FRAZIER 10FR DISP (MISCELLANEOUS) IMPLANT
SUT ETHILON 4 0 PS 2 18 (SUTURE) IMPLANT
SUT FIBERWIRE #2 38 T-5 BLUE (SUTURE)
SUT FIBERWIRE #5 38 CONV NDL (SUTURE)
SUT FIBERWIRE 2-0 18 17.9 3/8 (SUTURE)
SUT MNCRL AB 3-0 PS2 18 (SUTURE) ×3 IMPLANT
SUT VIC AB 0 CT1 27 (SUTURE) ×2
SUT VIC AB 0 CT1 27XBRD ANBCTR (SUTURE) ×1 IMPLANT
SUT VIC AB 1 CT1 27 (SUTURE) ×4
SUT VIC AB 1 CT1 27XBRD ANBCTR (SUTURE) ×2 IMPLANT
SUT VIC AB 2-0 SH 27 (SUTURE) ×2
SUT VIC AB 2-0 SH 27XBRD (SUTURE) ×1 IMPLANT
SUT VIC AB 3-0 FS2 27 (SUTURE) IMPLANT
SUTURE FIBERWR #2 38 T-5 BLUE (SUTURE) IMPLANT
SUTURE FIBERWR #5 38 CONV NDL (SUTURE) IMPLANT
SUTURE FIBERWR 2-0 18 17.9 3/8 (SUTURE) IMPLANT
SYR 3ML 23GX1 SAFETY (SYRINGE) IMPLANT
SYR 5ML LL (SYRINGE) ×3 IMPLANT
SYR BULB EAR ULCER 3OZ GRN STR (SYRINGE) IMPLANT
TOWEL GREEN STERILE FF (TOWEL DISPOSABLE) ×3 IMPLANT
TUBING ARTHROSCOPY IRRIG 16FT (MISCELLANEOUS) ×3 IMPLANT
UNDERPAD 30X36 HEAVY ABSORB (UNDERPADS AND DIAPERS) IMPLANT
WATER STERILE IRR 1000ML POUR (IV SOLUTION) IMPLANT
WRAP KNEE MAXI GEL POST OP (GAUZE/BANDAGES/DRESSINGS) ×3 IMPLANT
YANKAUER SUCT BULB TIP NO VENT (SUCTIONS) ×3 IMPLANT

## 2020-05-24 NOTE — Interval H&P Note (Signed)
History and Physical Interval Note:  05/24/2020 1:10 PM  Shawn [name]  has presented today for surgery, with the diagnosis of LEFT MEDIAL MENISCUS TEAR, SPONTANEOUS RUPTURE OF EXTENSOR TENDONS LEFT LOWER LEG S83.249A, [id number].  The various methods of treatment have been discussed with the patient and family. After consideration of risks, benefits and other options for treatment, the patient has consented to  Procedure(s): LEFT KNEE ARTHROSCOPY WITH MEDIAL MENISECTOMY (Left) LEFT PATELLA TENDON REPAIR (Left) as a surgical intervention.  The patient's history has been reviewed, patient examined, no change in status, stable for surgery.  I have reviewed the patient's chart and labs.  Questions were answered to the patient's satisfaction.     Thera Flake

## 2020-05-24 NOTE — Discharge Instructions (Signed)
Diet: As you were doing prior to hospitalization   Activity: Increase activity slowly as tolerated  No lifting or driving for [address] [place]  Keeping leg out straight in knee immobilizer   Shower: leave dressing in place until f/u, keep clean and dry  Dressing:  leave dressing in place until f/u, keep clean and dry  Weight Bearing: weight bearing as tolerated IN KNEE IMMOBILIZER ONLY. Use a walker or  Crutches as instructed.   To prevent constipation: you may use a stool softener such as -  Colace ( over the counter) 100 mg by mouth twice a day  Drink plenty of fluids ( prune juice may be helpful) and high fiber foods  Miralax ( over the counter) for constipation as needed.   Precautions: If you experience chest pain or shortness of breath - call 911 immediately For transfer to the hospital emergency department!!  If you develop a fever greater that 101 F, purulent drainage from wound, increased redness or drainage from wound, or calf pain -- Call the office   Follow- Up Appointment: Please call for an appointment to be seen in 06/04/20  [facility] - [phone]  In addition to pain meds you are to take an 81mg  Aspirin twice a day starting 05/25/20 for 30 days to prevent blood clots.   No Tylenol before 5:45pm.   Post Anesthesia Home Care Instructions  Activity: Get plenty of rest for the remainder of the day. A responsible individual must stay with you for 24 hours following the procedure.  For the next 24 hours, DO NOT: -Drive a car -07/25/20 -Drink alcoholic beverages -Take any medication unless instructed by your physician -Make any legal decisions or sign important papers.  Meals: Start with liquid foods such as gelatin or soup. Progress to regular foods as tolerated. Avoid greasy, spicy, heavy foods. If nausea and/or vomiting occur, drink only clear liquids until the nausea and/or vomiting subsides. Call your physician if vomiting continues.  Special  Instructions/Symptoms: Your throat may feel dry or sore from the anesthesia or the breathing tube placed in your throat during surgery. If this causes discomfort, gargle with warm salt water. The discomfort should disappear within 24 hours.  If you had a scopolamine patch placed behind your ear for the management of post- operative nausea and/or vomiting:  1. The medication in the patch is effective for 72 hours, after which it should be removed.  Wrap patch in a tissue and discard in the trash. Wash hands thoroughly with soap and water. 2. You may remove the patch earlier than 72 hours if you experience unpleasant side effects which may include dry mouth, dizziness or visual disturbances. 3. Avoid touching the patch. Wash your hands with soap and water after contact with the patch.     Regional Anesthesia Blocks  1. Numbness or the inability to move the "blocked" extremity may last from 3-48 hours after placement. The length of time depends on the medication injected and your individual response to the medication. If the numbness is not going away after 48 hours, call your surgeon.  2. The extremity that is blocked will need to be protected until the numbness is gone and the  Strength has returned. Because you cannot feel it, you will need to take extra care to avoid injury. Because it may be weak, you may have difficulty moving it or using it. You may not know what position it is in without looking at it while the block is in effect.  3. For blocks in the legs and feet, returning to weight bearing and walking needs to be done carefully. You will need to wait until the numbness is entirely gone and the strength has returned. You should be able to move your leg and foot normally before you try and bear weight or walk. You will need someone to be with you when you first try to ensure you do not fall and possibly risk injury.  4. Bruising and tenderness at the needle site are common side effects and  will resolve in a few days.  5. Persistent numbness or new problems with movement should be communicated to the surgeon or the [facility] [phone] [facility] [phone]).

## 2020-05-24 NOTE — Anesthesia Preprocedure Evaluation (Addendum)
Anesthesia Evaluation  Patient identified by MRN, date of birth, ID band Patient awake    Reviewed: Allergy & Precautions, NPO status , Patient's Chart, lab work & pertinent test results  Airway Mallampati: I  TM Distance: >3 FB Neck ROM: Full    Dental  (+) Teeth Intact, Dental Advisory Given,    Pulmonary sleep apnea ,    Pulmonary exam normal breath sounds clear to auscultation       Cardiovascular negative cardio ROS Normal cardiovascular exam Rhythm:Regular Rate:Normal     Neuro/Psych negative neurological ROS  negative psych ROS   GI/Hepatic negative GI ROS, Neg liver ROS,   Endo/Other  negative endocrine ROS  Renal/GU negative Renal ROS     Musculoskeletal LEFT MEDIAL MENISCUS TEAR, SPONTANEOUS RUPTURE OF EXTENSOR TENDONS LEFT LOWER LEG    Abdominal   Peds  Hematology negative hematology ROS (+)   Anesthesia Other Findings Day of surgery medications reviewed with the patient.  Reproductive/Obstetrics                            Anesthesia Physical Anesthesia Plan  ASA: II  Anesthesia Plan: General   Post-op Pain Management:  Regional for Post-op pain   Induction: Intravenous  PONV Risk Score and Plan: 3 and Midazolam, Dexamethasone and Ondansetron  Airway Management Planned: LMA  Additional Equipment:   Intra-op Plan:   Post-operative Plan: Extubation in OR  Informed Consent: I have reviewed the patients History and Physical, chart, labs and discussed the procedure including the risks, benefits and alternatives for the proposed anesthesia with the patient or authorized representative who has indicated his/her understanding and acceptance.     Dental advisory given  Plan Discussed with: CRNA  Anesthesia Plan Comments:         Anesthesia Quick Evaluation

## 2020-05-24 NOTE — Progress Notes (Signed)
Assisted Dr. Turk with left, ultrasound guided, femoral block. Side rails up, monitors on throughout procedure. See vital signs in flow sheet. Tolerated Procedure well. 

## 2020-05-24 NOTE — Anesthesia Procedure Notes (Signed)
Procedure Name: LMA Insertion Performed by: Karen Kitchens, CRNA Pre-anesthesia Checklist: Patient identified, Emergency Drugs available, Suction available, Patient being monitored and Timeout performed Patient Re-evaluated:Patient Re-evaluated prior to induction Oxygen Delivery Method: Circle system utilized Preoxygenation: Pre-oxygenation with 100% oxygen Induction Type: IV induction Ventilation: Mask ventilation without difficulty LMA Size: 4.0 Number of attempts: 1 Airway Equipment and Method: Bite block Placement Confirmation: ETT inserted through vocal cords under direct vision,  positive ETCO2,  breath sounds checked- equal and bilateral and CO2 detector Tube secured with: Tape Dental Injury: Teeth and Oropharynx as per pre-operative assessment

## 2020-05-24 NOTE — Brief Op Note (Signed)
05/24/2020  3:10 PM  PATIENT:  Shawn [name]  35 y.o. male  PRE-OPERATIVE DIAGNOSIS:  LEFT MEDIAL MENISCUS TEAR, SPONTANEOUS RUPTURE OF EXTENSOR TENDONS LEFT LOWER LEG S83.249A, [id number]  POST-OPERATIVE DIAGNOSIS:   SPONTANEOUS RUPTURE OF EXTENSOR TENDONS LEFT LOWER LEG S83.249A, G89.169  PROCEDURE:  Procedure(s): LEFT KNEE DIAGNOSTIC  ARTHROSCOPY (Left) LEFT PATELLA TENDON REPAIR (Left)  SURGEON:  Surgeon(s) and Role:    Frederico Hamman, MD - Primary  PHYSICIAN ASSISTANT: Margart Sickles, PA-C  ASSISTANTS:    ANESTHESIA:   regional and general  EBL:  minimal   BLOOD ADMINISTERED:none  DRAINS: none   LOCAL MEDICATIONS USED:  NONE  SPECIMEN:  No Specimen  DISPOSITION OF SPECIMEN:  N/A  COUNTS:  YES  TOURNIQUET:   Total Tourniquet Time Documented: Thigh (Left) - 66 minutes Total: Thigh (Left) - 66 minutes   DICTATION: .Other Dictation: Dictation Number unknown  PLAN OF CARE: Discharge to home after PACU  PATIENT DISPOSITION:  PACU - hemodynamically stable.   Delay start of Pharmacological VTE agent (>24hrs) due to surgical blood loss or risk of bleeding: yes

## 2020-05-24 NOTE — Transfer of Care (Signed)
Immediate Anesthesia Transfer of Care Note  Patient: Shawn [name]  Procedure(s) Performed: LEFT KNEE DIAGNOSTIC  ARTHROSCOPY (Left Knee) LEFT PATELLA TENDON REPAIR (Left Knee)  Patient Location: PACU  Anesthesia Type:General and Regional  Level of Consciousness: awake, alert  and oriented  Airway & Oxygen Therapy: Patient Spontanous Breathing and Patient connected to face mask oxygen  Post-op Assessment: Report given to RN and Post -op Vital signs reviewed and stable  Post vital signs: Reviewed and stable  Last Vitals:  Vitals Value Taken Time  BP 120/70 05/24/20 1509  Temp    Pulse 87 05/24/20 1511  Resp 19 05/24/20 1511  SpO2 100 % 05/24/20 1511  Vitals shown include unvalidated device data.  Last Pain:  Vitals:   05/24/20 1140  TempSrc: Oral  PainSc: 5       Patients Stated Pain Goal: 5 (05/24/20 1140)  Complications: No complications documented.

## 2020-05-24 NOTE — Anesthesia Procedure Notes (Signed)
Anesthesia Regional Block: Femoral nerve block   Pre-Anesthetic Checklist: ,, timeout performed, Correct Patient, Correct Site, Correct Laterality, Correct Procedure, Correct Position, site marked, Risks and benefits discussed,  Surgical consent,  Pre-op evaluation,  At surgeon's request and post-op pain management  Laterality: Left  Prep: chloraprep       Needles:  Injection technique: Single-shot  Needle Type: Echogenic Needle     Needle Length: 9cm  Needle Gauge: 21     Additional Needles:   Procedures:,,,, ultrasound used (permanent image in chart),,,,  Narrative:  Start time: 05/24/2020 12:05 PM End time: 05/24/2020 12:15 PM Injection made incrementally with aspirations every 5 mL.  Performed by: Personally  Anesthesiologist: Cecile Hearing, MD  Additional Notes: No pain on injection. No increased resistance to injection. Injection made in 5cc increments.  Good needle visualization.  Patient tolerated procedure well.

## 2020-05-25 ENCOUNTER — Encounter (HOSPITAL_BASED_OUTPATIENT_CLINIC_OR_DEPARTMENT_OTHER): Payer: Self-pay | Admitting: Orthopedic Surgery

## 2020-05-25 NOTE — Op Note (Signed)
Shawn [name], Shawn [name] MEDICAL RECORD NO: [id number] ACCOUNT NO: [id number] DATE OF BIRTH: 1985/08/02 FACILITY: [facility] LOCATION: [facility] PHYSICIAN: W D. Carloyn Manner., MD  Operative Report   DATE OF PROCEDURE: 05/24/2020  PREOPERATIVE DIAGNOSES: 1.  Torn infrapatellar tendon, left knee. 2.  Torn medial meniscus, left knee.  POSTOPERATIVE DIAGNOSIS:  Torn infrapatellar tendon, left knee.  OPERATION: 1.  Primary repair infrapatellar tendon tear. 2.  Diagnostic arthroscopy.  SURGEON:  W D. Carloyn Manner., MD  ASSISTANTVincent Peyer.  TOURNIQUET TIME:  Approximately 1 hour.  ANESTHESIA:  General with a block.  DESCRIPTION OF PROCEDURE:  The patient had a preoperative MRI that was reported out as having had a medial meniscus tear, in addition to a complete rupture of his infrapatellar tendon.  For that reason, arthroscopy was done prior to the repair of the  tendon through 2 standard portals.  Systematic inspection of the knee did not show a discrete tear.  There was some mild fraying of the lateral meniscus.  Articular cartilage was all normal as was the ACL and PCL.  After the completion of the diagnostic  arthroscopy, we made an incision over the torn infrapatellar tendon, was torn off the inferior pole.  We freshened the attachment site down to some bleeding bone.  We then used Arthrex SutureTapes with a 5.5 Bio-Corkscrew SutureTape through a drill hole  on the inferior pole of the patella weaving a modified [place] suture with one of the FiberTapes of the two that were on the Bio-Corkscrew.  We did two such sutures to secure the tendon back into the bone.  We then used the remaining FiberTape to oversew  some tendinous tissue on top of the patella.  This anatomically repaired the tendon.there was no severe tendinopathy noted, although obviously had to have some at the attachment site.  Retinacular was widely torn  on the medial side of the knee and we repaired that with Vicryl.  Small  retinacular tear on the lateral repaired with Vicryl.  The repair was tested without any undue tension being placed on it.  Knee flexed to 45 degrees without any problems relative to  the repair holding nicely.  The incision was finally closed with 0, 2-0 Vicryl, Monocryl, placed in a knee immobilizer with the knee in full extension.  Tourniquet was released after application of the dressing, taken to recovery room in stable  condition.   NIK D: 05/24/2020 2:54:57 pm T: 05/25/2020 8:08:00 am  JOB: [id number]/ [id number]

## 2020-05-25 NOTE — Anesthesia Postprocedure Evaluation (Signed)
Anesthesia Post Note  Patient: Shawn [name]  Procedure(s) Performed: LEFT KNEE DIAGNOSTIC  ARTHROSCOPY (Left Knee) LEFT PATELLA TENDON REPAIR (Left Knee)     Patient location during evaluation: PACU Anesthesia Type: General and Regional Level of consciousness: awake and alert Pain management: pain level controlled Vital Signs Assessment: post-procedure vital signs reviewed and stable Respiratory status: spontaneous breathing, nonlabored ventilation, respiratory function stable and patient connected to nasal cannula oxygen Cardiovascular status: blood pressure returned to baseline and stable Postop Assessment: no apparent nausea or vomiting Anesthetic complications: no   No complications documented.  Last Vitals:  Vitals:   05/24/20 1615 05/24/20 1715  BP: (!) 132/95 138/82  Pulse: 79 72  Resp: 16 16  Temp:    SpO2: 99% 99%    Last Pain:  Vitals:   05/24/20 1715  TempSrc:   PainSc: 4                  Juvia Aerts P [place]

## 2020-06-01 NOTE — Op Note (Signed)
[name], [name] MEDICAL RECORD NO: [id number] ACCOUNT NO: [id number] DATE OF BIRTH: 1985/08/17 FACILITY: [facility] LOCATION: [facility] PHYSICIAN: W D. Carloyn Manner., MD  Operative Report   DATE OF PROCEDURE: 05/24/2020  PREOPERATIVE DIAGNOSES: 1.  Torn medial meniscus, left knee. 2.  Torn patellar tendon, left knee.  POSTOPERATIVE DIAGNOSIS:  Torn patellar tendon, left knee.  OPERATION: 1.  Repair of patellar tendon. 2.  Diagnostic arthroscopy.  SURGEON:  Thera Flake., MD  ASSISTANTVincent Peyer, PA  TOURNIQUET TIME:  Approximately 1 hour.  ANESTHESIA:  General anesthetic with a nerve block.  DESCRIPTION OF PROCEDURE:  The patient had an MRI suggesting meniscal pathology.  He had diagnostic arthroscopy through standard medial and lateral portals and despite the finding on MRI of a torn medial meniscus, there was no meniscus tear; therefore,  we performed a diagnostic arthroscopy.  The leg was exsanguinated after completion of the arthroscopy and thigh tourniquet inflated to 350 mm.  We did a straight skin incision over the patellar tendon, patella area revealing a complete tear of the patellar tendon.  We freshened the ends of the  tendon with sharp debridement as well as the inferior pole of the patella.  The tendon was reapproximated using 2 Bio-Corkscrew sutures 5.5 mm in diameter.  We wove a stitch from the end of the tendon back around itself through the other end and we used  a sliding technique to imbricate the tendon with a FiberTape.  We did two such sutures repairing the tendon nicely.  Edge of the tendon could be oversewn after that with a running FiberTape.  Excellent repair was obtained relative to the reattachment to  the inferior pole.  The retinaculum had been split.  This was reapproximated as well using #1 Vicryl.  Subcutaneous tissues were reapproximated using Vicryl with Monocryl in the skin.  Lightly compressive sterile dressing and knee immobilizer applied.    The tourniquet was released after application of the dressing.   NIK D: 06/01/2020 7:46:12 am T: 06/01/2020 10:50:00 am  JOB: [id number]/ [id number]

## 2022-04-04 IMAGING — CR DG KNEE 1-2V*L*
3 series · 3 of 3 positions shown · non-contrast
Comparison: None.

CLINICAL DATA: 33-year-old male with trauma to the left knee.

EXAM:
LEFT KNEE - 1-2 VIEW

[x knee lat left (1 of 2)]
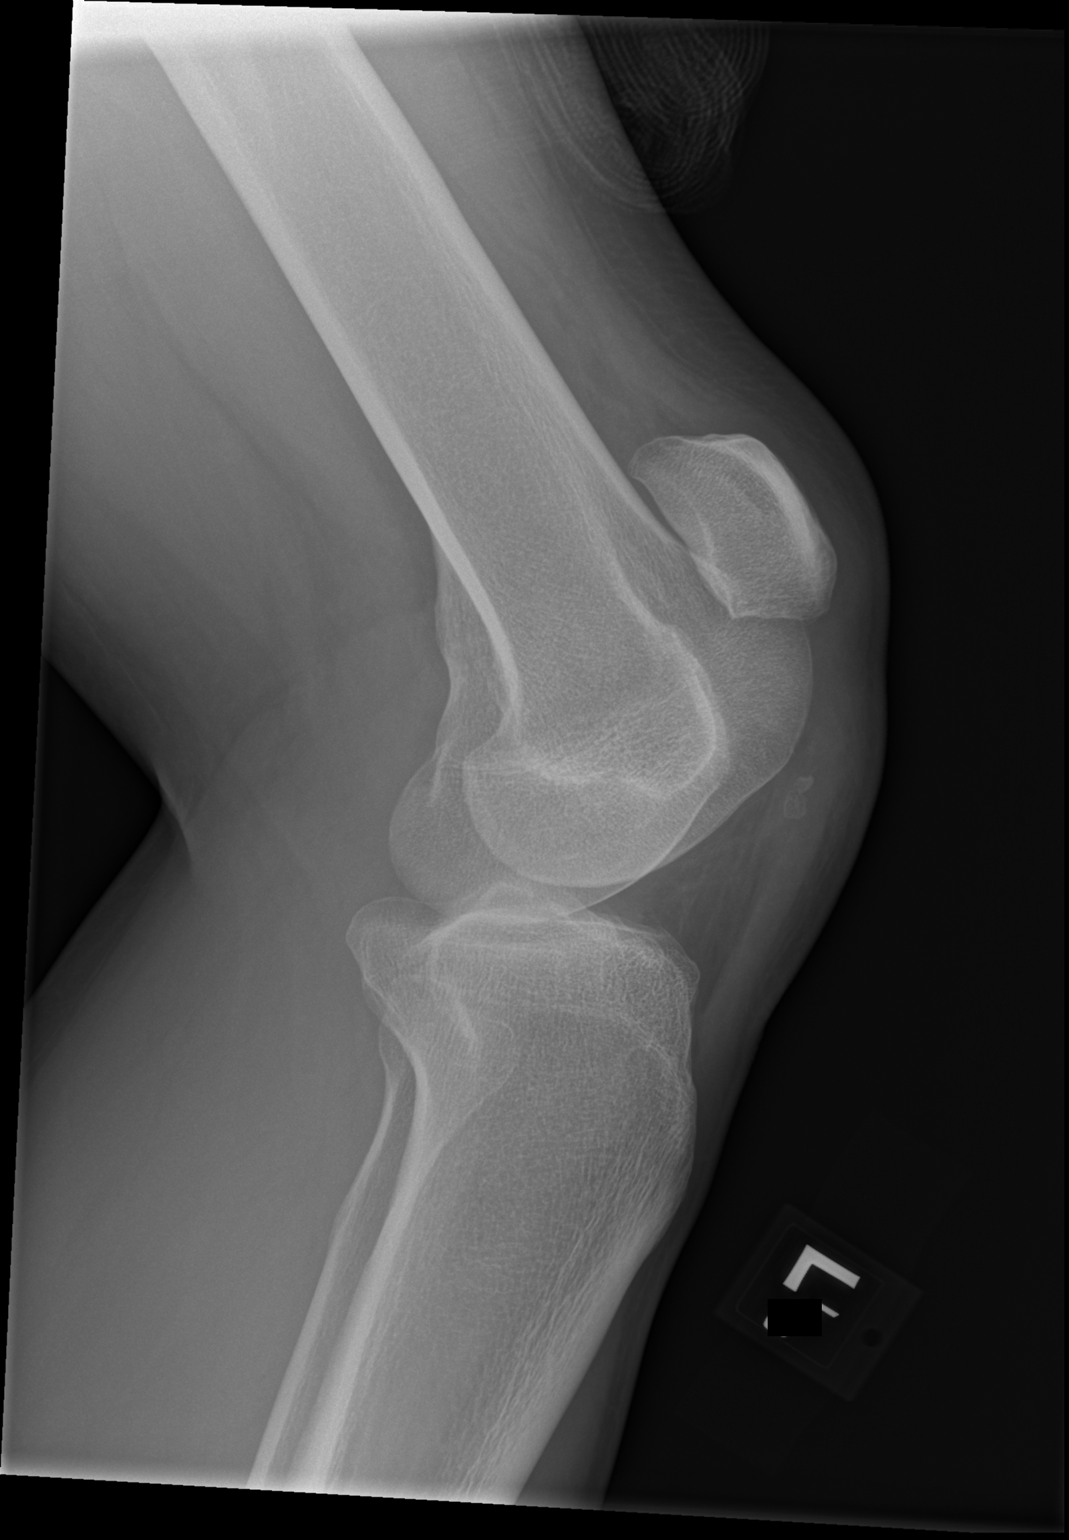

[x knee lat left (2 of 2)]
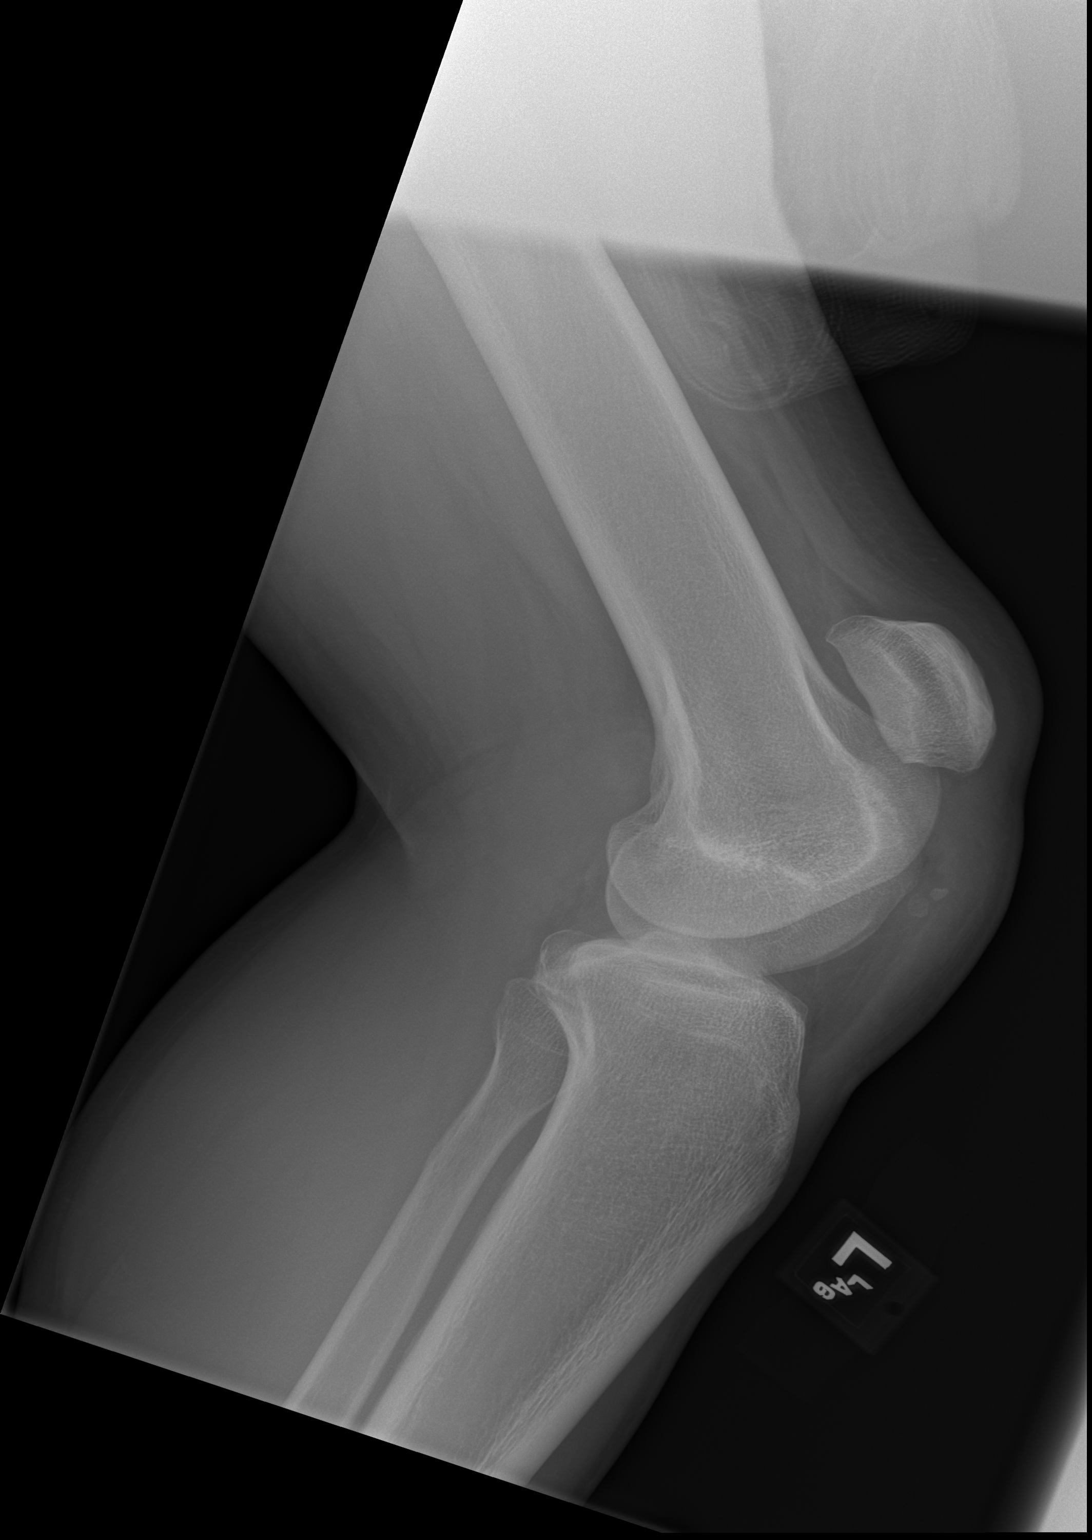

[x knee ap left]
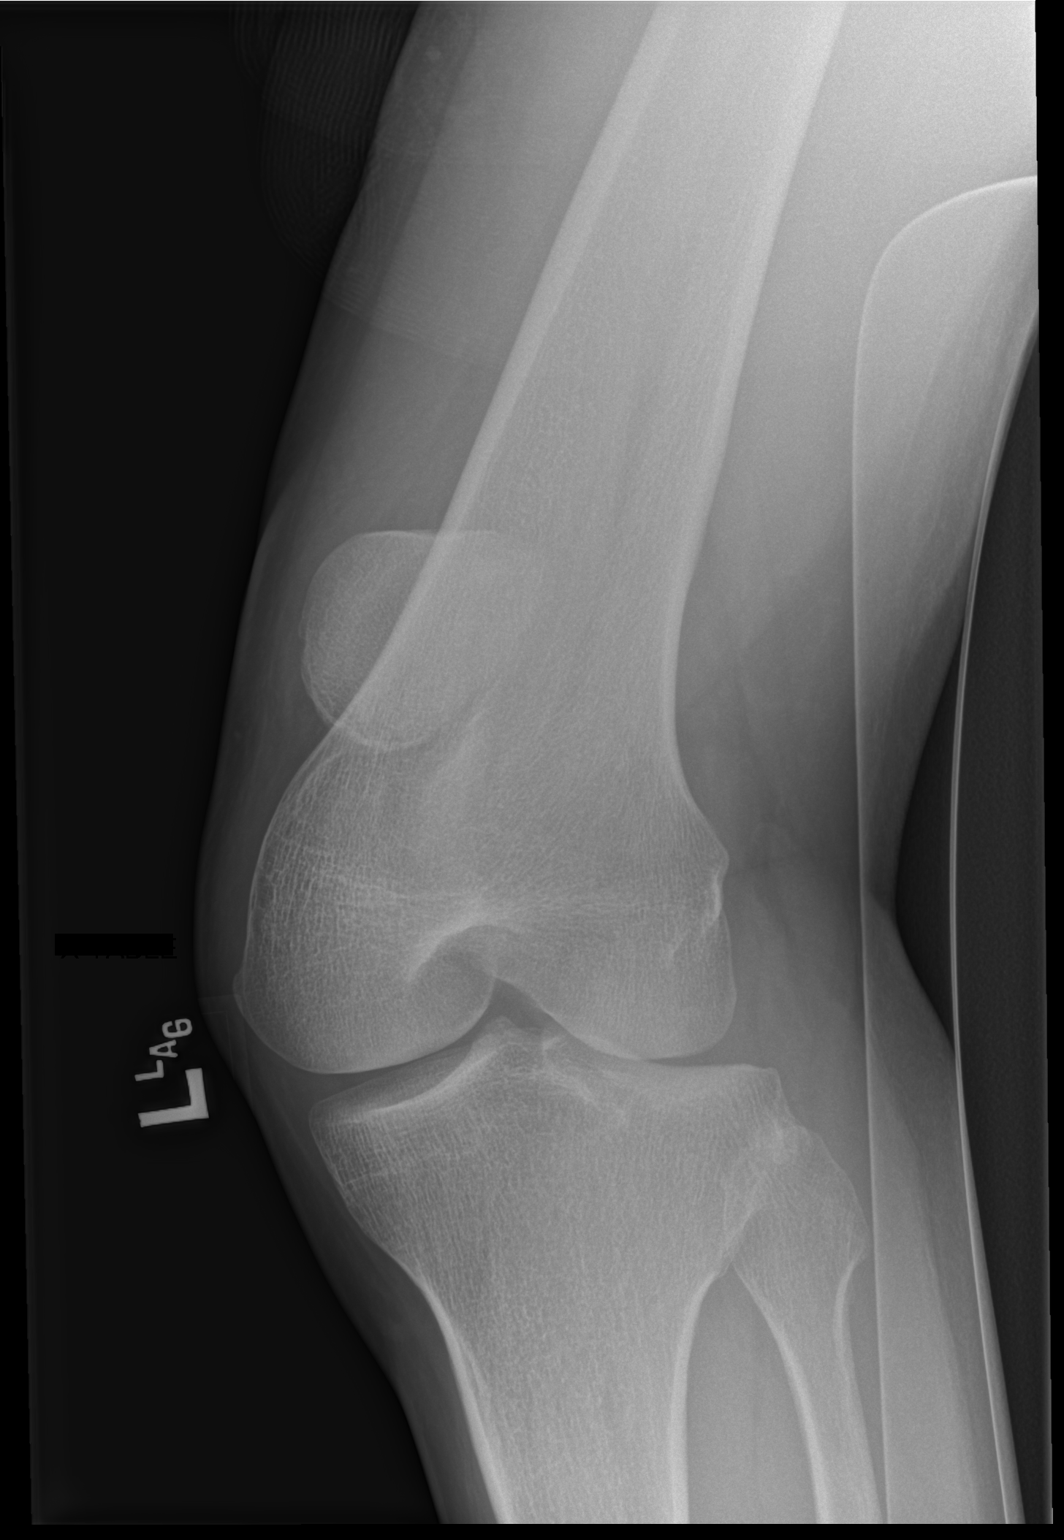

[3 of 3 positions shown; findings below may reference images not displayed]

FINDINGS: There is elevation of the patella. Two small bone fragments in the
anterior knee adjacent to the patellar tendon are suspicious for
avulsion fracture. There is apparent 3 cm gap between the proximal
patellar tendon and inferior patella suspicious for tendon rupture.
No other acute fracture. The bones are well mineralized. No
arthritic changes. No significant joint effusion. Mild soft tissues
swelling of the anterior knee.
IMPRESSION: Findings most suspicious for patellar tendon rupture and avulsion
from the patellar apex. There is elevation of the patella. Further
evaluation with MRI is recommended.

## 2022-04-04 IMAGING — MR MR KNEE*L* W/O CM
7 series · 39 of 40 positions shown · non-contrast
Comparison: Radiograph same day

CLINICAL DATA: Basketball injury with knee pain

EXAM:
MRI OF THE LEFT KNEE WITHOUT CONTRAST
TECHNIQUE: Multiplanar, multisequence MR imaging of the knee was performed. No
intravenous contrast was administered.

[Series 9: PD fat-sat · sagittal · left · 4.0mm · 0.47mm/px · 5 of 22 slices shown (1 of 3)]
[im 1/22]
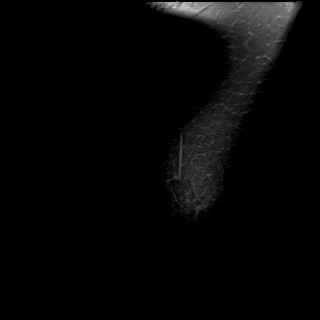
[im 6/22]
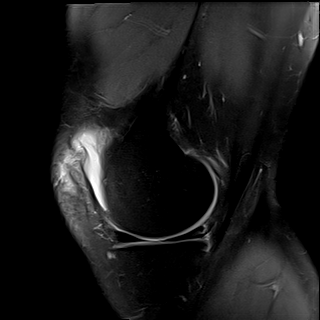
[im 11/22]
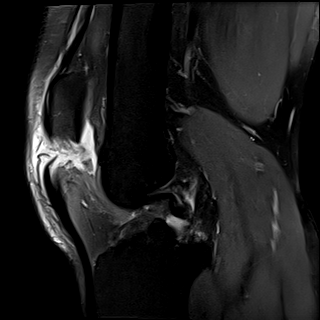
[im 16/22]
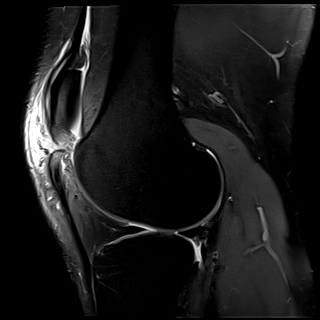
[im 22/22]
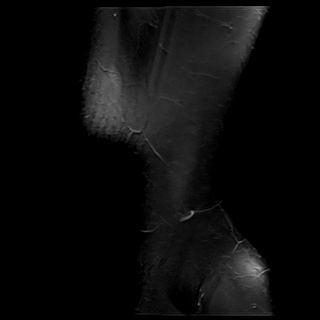

[Series 10: T2 fat-sat · sagittal · left · 4.0mm · 0.47mm/px · 5 of 22 slices shown (1 of 3)]
[im 1/22]
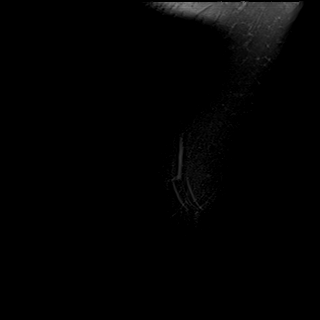
[im 6/22]
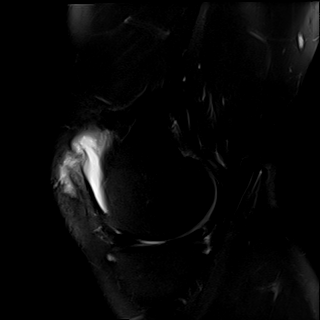
[im 11/22]
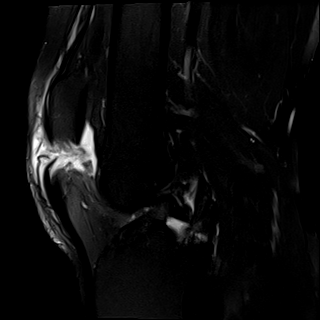
[im 16/22]
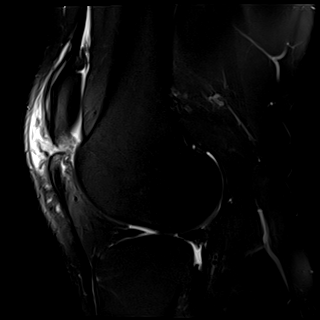
[im 22/22]
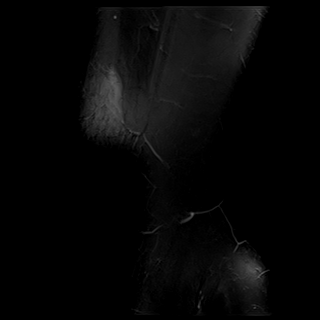

[Series 11: T2 fat-sat · axial · left · 4.0mm · 0.50mm/px · z∈[-90,+64]mm · 8 of 36 slices shown (2 of 3)]
[im 1/36]
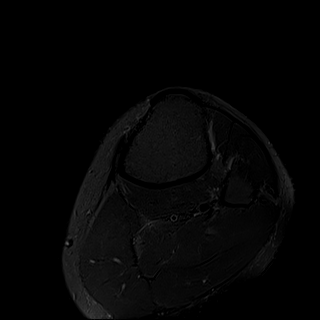
[im 6/36]
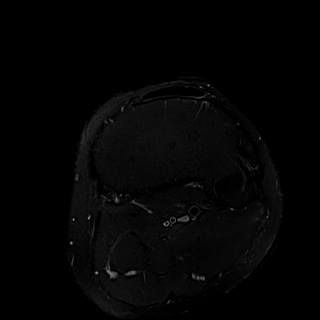
[im 11/36]
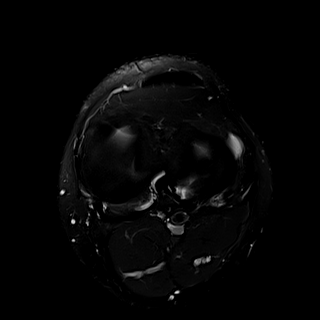
[im 16/36]
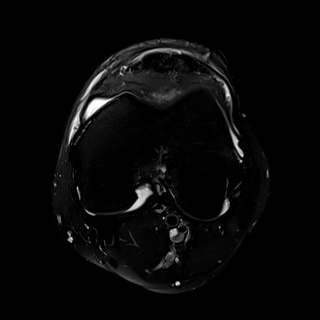
[im 21/36]
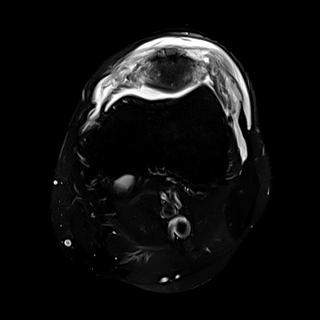
[im 26/36]
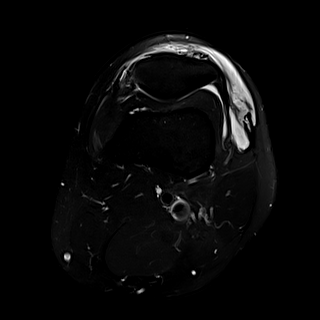
[im 31/36]
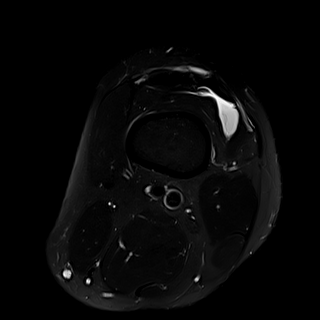
[im 36/36]
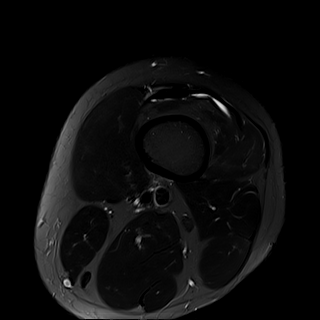

[Series 12: T1 · coronal · left · 4.0mm · 0.29mm/px · 5 of 25 slices shown]
[im 1/25]
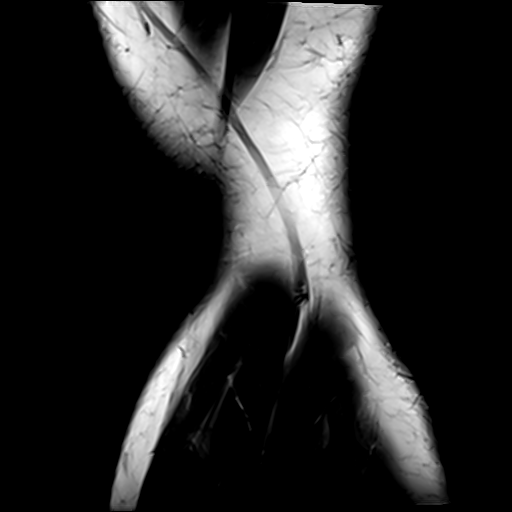
[im 5/25]
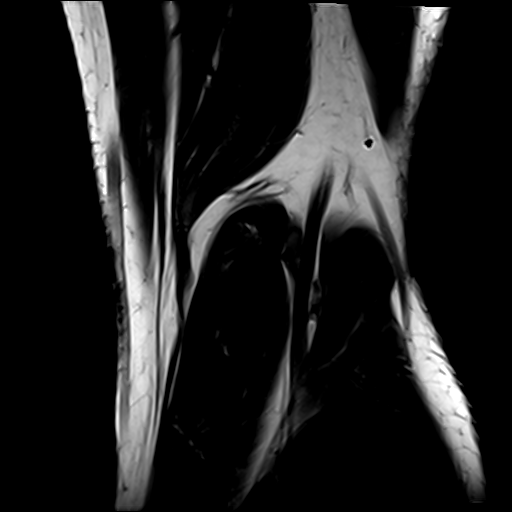
[im 10/25]
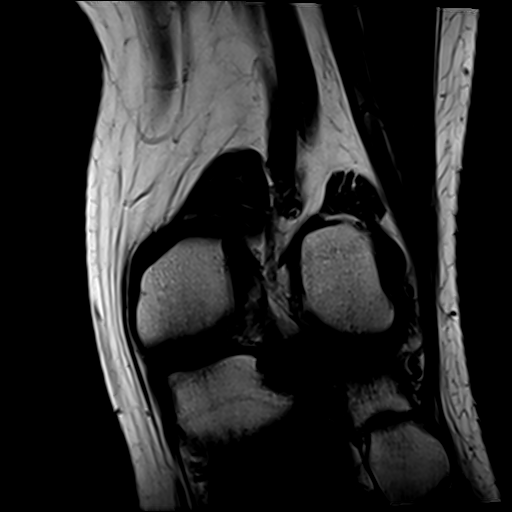
[im 15/25]
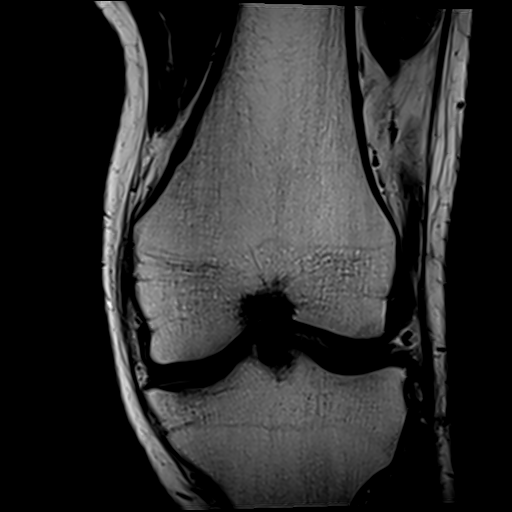
[im 20/25]
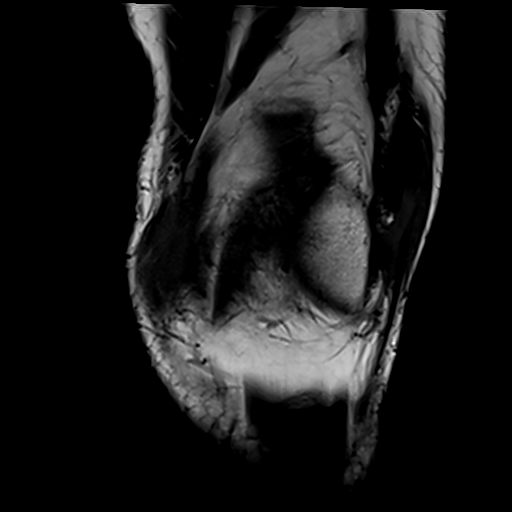

[Series 13: T2 fat-sat · coronal · left · 4.0mm · 0.59mm/px · 6 of 25 slices shown (3 of 3)]
[im 1/25]
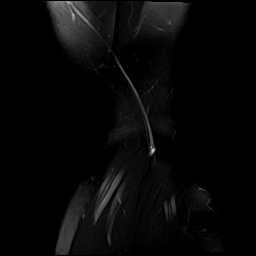
[im 5/25]
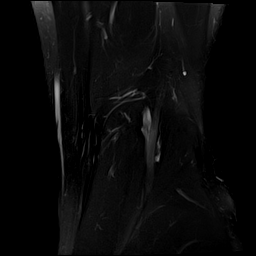
[im 10/25]
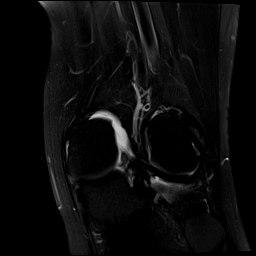
[im 15/25]
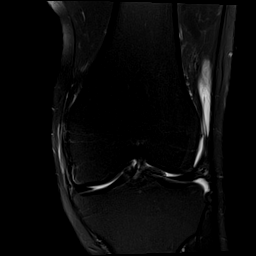
[im 20/25]
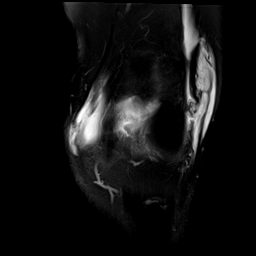
[im 25/25]
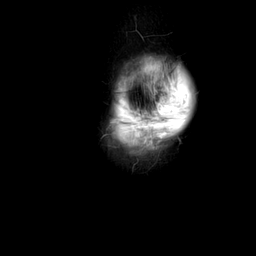

[Series 14: PD fat-sat · coronal · left · 4.0mm · 0.47mm/px · 6 of 25 slices shown (2 of 3)]
[im 1/25]
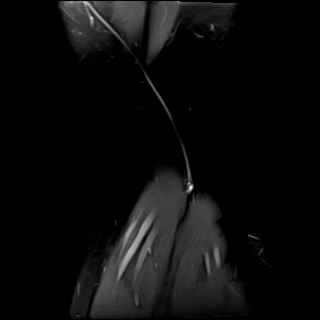
[im 5/25]
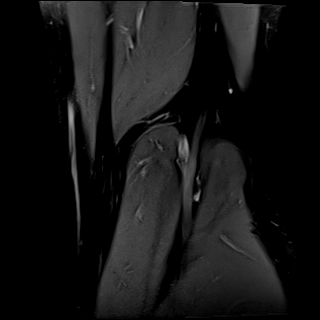
[im 10/25]
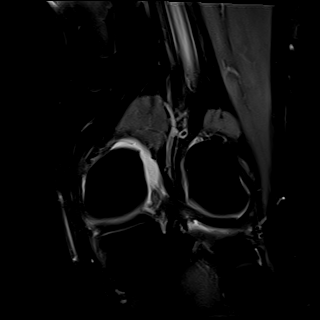
[im 15/25]
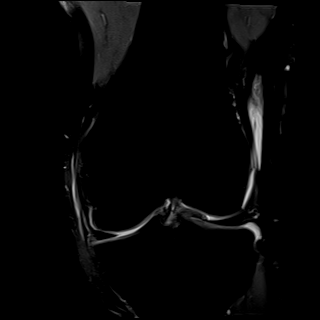
[im 20/25]
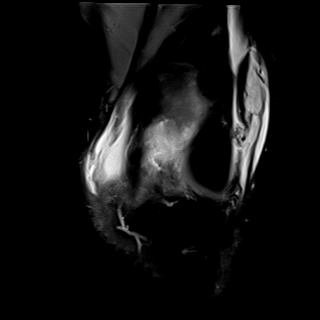
[im 25/25]
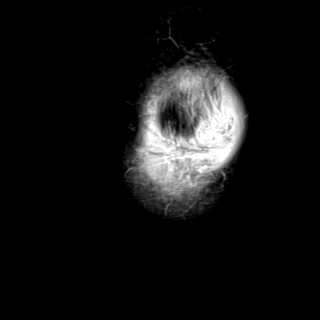

[Series 15: PD fat-sat · oblique · left · 2.0mm · 0.47mm/px · 4 of 15 slices shown (3 of 3)]
[im 1/15]
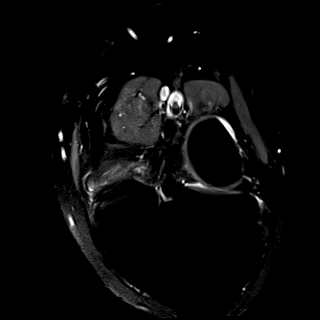
[im 5/15]
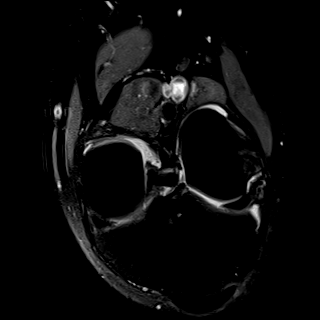
[im 10/15]
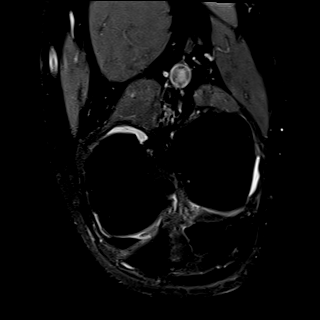
[im 15/15]
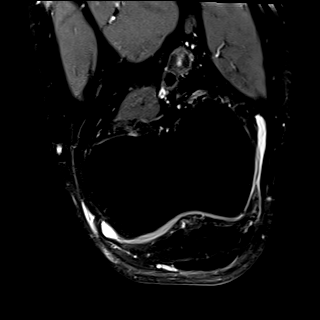

[39 of 40 positions shown; findings below may reference images not displayed]

FINDINGS: MENISCI

Medial: There is a nondisplaced horizontal longitudinal tear seen of
the posterior horn of the medial meniscus.

Lateral: Intact.

LIGAMENTS

Cruciates: There is increased signal seen throughout the ACL with a
probable partial interstitial tear seen at the posterior femoral
insertion site. The PCL is intact.

Collaterals: Mildly increased signal seen around the medial
collateral ligament. The lateral collateral ligamentous complex is
intact.

CARTILAGE

Patellofemoral: Normal.

Medial compartment: Normal.

Lateral compartment: Normal.

BONES: Increased marrow signal seen at the inferior patellar pole.
No displaced fractures are identified.

JOINT: A small knee joint effusion is present.

EXTENSOR MECHANISM: Complete disruption of the patellar tendon from
the inferior patellar pole with approximately 7 mm of tendon
retraction. Prepatellar edema with fluid and edema within the
Hoffa's fat pad is seen. There is slight patellar Alta present. The
quadriceps tendon is intact.

POPLITEAL FOSSA: No popliteal cyst.

OTHER:  The visualized muscles are normal in appearance.
IMPRESSION: Complete tear of the patellar tendon from the inferior patellar pole
with approximately 7 mm of tendon retraction and resultant patellar
Alta. There is adjacent reactive marrow at the inferior patellar
pole.

Significant surrounding prepatellar and Hoffa's fat pad edema

Nondisplaced posterior medial meniscus tear

Intrasubstance sprain of the ACL with a probable tiny interstitial
tear
